# Patient Record
Sex: Female | Born: 2000
Health system: Southern US, Community
[De-identification: ages and names within clinical notes are randomized; demographics above are authoritative.]

## PROBLEM LIST (undated history)

## (undated) DIAGNOSIS — R059 Cough, unspecified: Secondary | ICD-10-CM

## (undated) DIAGNOSIS — J310 Chronic rhinitis: Secondary | ICD-10-CM

## (undated) DIAGNOSIS — J4530 Mild persistent asthma, uncomplicated: Secondary | ICD-10-CM

## (undated) DIAGNOSIS — R05 Cough: Secondary | ICD-10-CM

## (undated) DIAGNOSIS — L209 Atopic dermatitis, unspecified: Secondary | ICD-10-CM

## (undated) HISTORY — DX: Cough: R05

## (undated) HISTORY — DX: Atopic dermatitis, unspecified: L20.9

## (undated) HISTORY — DX: Cough, unspecified: R05.9

## (undated) HISTORY — DX: Mild persistent asthma, uncomplicated: J45.30

## (undated) HISTORY — DX: Chronic rhinitis: J31.0

---
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
## 2019-10-08 ENCOUNTER — Telehealth (HOSPITAL_COMMUNITY): Payer: Self-pay | Admitting: Psychiatry

## 2019-10-08 NOTE — Telephone Encounter (Signed)
D:  Pt's mother Revonda Standard) called to inquire about MH-IOP.  A:  Answered all her questions.  Encouraged her to verify insurance benefits.  According to pt's mother, she will inform pt about MH-IOP and have pt to call case manager if she is interested.  R:  Mother receptive.

## 2019-11-25 ENCOUNTER — Telehealth (HOSPITAL_COMMUNITY): Payer: Self-pay | Admitting: Professional

## 2019-11-25 ENCOUNTER — Telehealth (HOSPITAL_COMMUNITY): Payer: Self-pay | Admitting: Psychiatry

## 2019-11-25 NOTE — Telephone Encounter (Signed)
D:  Patient and case manager been playing phone tag, until today.  A: Patient's therapist Durene Cal) referred pt for group.  Oriented patient.  Upon asking pt a few questions, case manager feels that pt is in need of a higher level of care, preferably inpatient at [facility].  Will contact Caryn Bee.

## 2020-04-21 ENCOUNTER — Telehealth (HOSPITAL_COMMUNITY): Payer: Self-pay | Admitting: Licensed Clinical Social Worker

## 2020-04-27 ENCOUNTER — Other Ambulatory Visit: Payer: Self-pay

## 2020-04-27 ENCOUNTER — Other Ambulatory Visit (HOSPITAL_COMMUNITY): Payer: BC Managed Care – PPO | Attending: Psychiatry | Admitting: Licensed Clinical Social Worker

## 2020-04-27 DIAGNOSIS — R4588 Nonsuicidal self-harm: Secondary | ICD-10-CM | POA: Insufficient documentation

## 2020-04-27 DIAGNOSIS — Z79899 Other long term (current) drug therapy: Secondary | ICD-10-CM | POA: Insufficient documentation

## 2020-04-27 DIAGNOSIS — R45851 Suicidal ideations: Secondary | ICD-10-CM | POA: Insufficient documentation

## 2020-04-27 DIAGNOSIS — F32A Depression, unspecified: Secondary | ICD-10-CM | POA: Insufficient documentation

## 2020-04-27 DIAGNOSIS — F332 Major depressive disorder, recurrent severe without psychotic features: Secondary | ICD-10-CM

## 2020-04-27 DIAGNOSIS — F411 Generalized anxiety disorder: Secondary | ICD-10-CM | POA: Insufficient documentation

## 2020-05-01 ENCOUNTER — Encounter (HOSPITAL_COMMUNITY): Payer: Self-pay

## 2020-05-01 ENCOUNTER — Other Ambulatory Visit: Payer: Self-pay

## 2020-05-01 ENCOUNTER — Other Ambulatory Visit (HOSPITAL_COMMUNITY): Payer: BC Managed Care – PPO | Admitting: Licensed Clinical Social Worker

## 2020-05-01 ENCOUNTER — Other Ambulatory Visit (HOSPITAL_COMMUNITY): Payer: BC Managed Care – PPO | Admitting: Occupational Therapy

## 2020-05-01 DIAGNOSIS — F32A Depression, unspecified: Secondary | ICD-10-CM | POA: Diagnosis not present

## 2020-05-01 DIAGNOSIS — R4588 Nonsuicidal self-harm: Secondary | ICD-10-CM | POA: Diagnosis not present

## 2020-05-01 DIAGNOSIS — R4589 Other symptoms and signs involving emotional state: Secondary | ICD-10-CM

## 2020-05-01 DIAGNOSIS — R41844 Frontal lobe and executive function deficit: Secondary | ICD-10-CM

## 2020-05-01 DIAGNOSIS — F411 Generalized anxiety disorder: Secondary | ICD-10-CM | POA: Diagnosis present

## 2020-05-01 DIAGNOSIS — F603 Borderline personality disorder: Secondary | ICD-10-CM

## 2020-05-01 DIAGNOSIS — R45851 Suicidal ideations: Secondary | ICD-10-CM | POA: Diagnosis not present

## 2020-05-01 DIAGNOSIS — Z79899 Other long term (current) drug therapy: Secondary | ICD-10-CM | POA: Diagnosis not present

## 2020-05-01 DIAGNOSIS — F332 Major depressive disorder, recurrent severe without psychotic features: Secondary | ICD-10-CM

## 2020-05-01 NOTE — Psych (Signed)
Virtual Visit via Video Note  I connected with Nicole Snyder on 04/27/20 at  2:00 PM EST by a video enabled telemedicine application and verified that I am speaking with the correct person using two identifiers.  Location: Patient: patient home Provider: clinical home office   I discussed the limitations of evaluation and management by telemedicine and the availability of in person appointments. The patient expressed understanding and agreed to proceed.  I discussed the assessment and treatment plan with the patient. The patient was provided an opportunity to ask questions and all were answered. The patient agreed with the plan and demonstrated an understanding of the instructions.   The patient was advised to call back or seek an in-person evaluation if the symptoms worsen or if the condition fails to improve as anticipated.  I provided 60 minutes of non-face-to-face time during this encounter.   Nicole Guiles, LCSW    Comprehensive Clinical Assessment (CCA) Note  04/27/2020 Nicole Snyder [id number]  Chief Complaint:  Chief Complaint  Patient presents with  . Depression  . Anxiety   Visit Diagnosis: Major Depressive Disorder  R/O Borderline personality disorder   CCA Biopsychosocial Intake/Chief Complaint:  Pt presents as referral from her therapist, T. Krumroy. Pt states she has been engaging in self harm cutting behaviors 3-4x/week and having passive SI 1-2x/week. Pt states reports struggles with social anxiety, mood swings, and depression. Pt reports struggling with school dynamics in high school and it getting worse in college, especially due to the pandemic changes. Pt reports she took a break from college this semester due to her MH. Pt reports overuse of her Gabapentin at times when upset. Pt denies other substance use, AVH, and HI. Pt denies current SI.  Current Symptoms/Problems: Pt reports depressed mood, anxiety, difficulty being around people, mood  swings, getting angry easily, self harm cutting 3-4x/weeks, overtaking medication (approx 12 pills instead of 3), passive SI 1-2x/week   Patient Reported Schizophrenia/Schizoaffective Diagnosis in Past: No   Strengths: Pt has regular therapist and psychiatrist; support in family  Preferences: No data recorded Abilities: No data recorded  Type of Services Patient Feels are Needed: intensive   Initial Clinical Notes/Concerns: Pt presents with low insight and struggles to make connections with some questions despite reporting she has been in therapy throughout high school to the present. Pt exhibits paucity and poor eye contact.   Mental Health Symptoms Depression:  Worthlessness; Sleep (too much or little); Hopelessness; Irritability; Change in energy/activity; Difficulty Concentrating   Duration of Depressive symptoms: Greater than two weeks   Mania:  None   Anxiety:   Irritability; Restlessness; Sleep; Tension; Worrying   Psychosis:  None   Duration of Psychotic symptoms: No data recorded  Trauma:  None   Obsessions:  None   Compulsions:  None   Inattention:  None   Hyperactivity/Impulsivity:  N/A   Oppositional/Defiant Behaviors:  None   Emotional Irregularity:  Intense/unstable relationships; Mood lability; Recurrent suicidal behaviors/gestures/threats; Potentially harmful impulsivity; Intense/inappropriate anger; Chronic feelings of emptiness; Unstable self-image   Other Mood/Personality Symptoms:  No data recorded   Mental Status Exam Appearance and self-care  Stature:  Average   Weight:  Average weight   Clothing:  Casual   Grooming:  Normal   Cosmetic use:  None   Posture/gait:  Normal   Motor activity:  Not Remarkable   Sensorium  Attention:  Confused; Normal   Concentration:  Normal; Anxiety interferes   Orientation:  X5   Recall/memory:  Normal  Affect and Mood  Affect:  Blunted; Appropriate   Mood:  Depressed; Anxious   Relating   Eye contact:  Fleeting   Facial expression:  Depressed; Anxious   Attitude toward examiner:  Cooperative; Passive   Thought and Language  Speech flow: Paucity   Thought content:  Appropriate to Mood and Circumstances   Preoccupation:  Ruminations   Hallucinations:  None   Organization:  No data recorded  [employer] of Knowledge:  Fair   Intelligence:  Average   Abstraction:  Functional   Judgement:  Poor   Reality Testing:  Adequate   Insight:  Lacking   Decision Making:  Vacilates   Social Functioning  Social Maturity:  No data recorded  Social Judgement:  No data recorded  Stress  Stressors:  Transitions   Coping Ability:  Deficient supports   Skill Deficits:  No data recorded  Supports:  Family     Religion:    Leisure/Recreation: Leisure / Recreation Do You Have Hobbies?: No  Exercise/Diet: Exercise/Diet Do You Exercise?: Yes What Type of Exercise Do You Do?: Run/Walk Have You Gained or Lost A Significant Amount of Weight in the Past Six Months?: No Do You Follow a Special Diet?: No Do You Have Any Trouble Sleeping?: Yes Explanation of Sleeping Difficulties: Pt reports difficulty falling asleep   CCA Employment/Education Employment/Work Situation: Employment / Work Situation Employment situation: Employed Where is patient currently employed?: [employer] How long has patient been employed?: approx 7 mo Patient's job has been impacted by current illness: No Has patient ever been in the [employer]?: No  Education: Education Is Patient Currently Attending School?: No Did Garment/textile technologist From [employer]?: Yes Did Theme park manager?: Yes Did You Have An Individualized Education Program (IIEP): No Patient's Education Has Been Impacted by Current Illness: Yes How Does Current Illness Impact Education?: Pt is on semester break from school due to struggling with MH   CCA Family/Childhood History Family and Relationship  History: Family history Marital status: Single Are you sexually active?: No Does patient have children?: No  Childhood History:  Childhood History By whom was/is the patient raised?: Mother/father and step-parent Additional childhood history information: Pt reports she saw her father drown at age 86 Description of patient's relationship with caregiver when they were a child: Pt reports good relationship with mom and step-dad Patient's description of current relationship with people who raised him/her: Pt reports mom is a major support; good with step-dad Does patient have siblings?: Yes Number of Siblings: 3 Description of patient's current relationship with siblings: Pt has older sister, older step-brother, and younger step-sister - pt reports "good" relationships Did patient suffer any verbal/emotional/physical/sexual abuse as a child?: No Did patient suffer from severe childhood neglect?: No Has patient ever been sexually abused/assaulted/raped as an adolescent or adult?: No Was the patient ever a victim of a crime or a disaster?: No Witnessed domestic violence?: No Has patient been affected by domestic violence as an adult?: No  Child/Adolescent Assessment:     CCA Substance Use Alcohol/Drug Use: Alcohol / Drug Use Pain Medications: Pt denies Prescriptions: see MAR Over the Counter: pt denies History of alcohol / drug use?: No history of alcohol / drug abuse (Pt reports overtaking her Gabapentin "sometimes" to feel "happy, light." Pt is prescribed the medication 3x/day and has taken up to 12 pills at a time. Pt states this happens less now that her mom monitors her medication)  ASAM's:  Six Dimensions of Multidimensional Assessment  Dimension 1:  Acute Intoxication and/or Withdrawal Potential:      Dimension 2:  Biomedical Conditions and Complications:      Dimension 3:  Emotional, Behavioral, or Cognitive Conditions and Complications:      Dimension 4:  Readiness to Change:     Dimension 5:  Relapse, Continued use, or Continued Problem Potential:     Dimension 6:  Recovery/Living Environment:     ASAM Severity Score:    ASAM Recommended Level of Treatment:     Substance use Disorder (SUD)    Recommendations for Services/Supports/Treatments: Recommendations for Services/Supports/Treatments Recommendations For Services/Supports/Treatments: Partial Hospitalization (Pt is recommended PHP due to ongoing SI/self harm and need for increased support for stabilization)  DSM5 Diagnoses: Patient Active Problem List   Diagnosis Date Noted  . Well adolescent visit 05/29/2017  . Folliculitis of both axillae 03/01/2017  . Insomnia 09/19/2016  . Borderline personality disorder (HCC) 03/08/2016  . Mood disorder (HCC) 09/27/2015  . Anxiety 09/02/2015  . Wheezing 10/05/2014  . BMI (body mass index), pediatric, 85% to less than 95% for age 74/18/2016  . Asthma, mild persistent 02/04/2011  . Atopic dermatitis 02/04/2011  . Environmental allergies 01/30/2011  . Pollen allergies 01/30/2011  . Cat allergies 01/30/2011    Patient Centered Plan: Patient is on the following Treatment Plan(s):  Anxiety and Depression   Referrals to Alternative Service(s): Referred to Alternative Service(s):   Place:   Date:   Time:    Referred to Alternative Service(s):   Place:   Date:   Time:    Referred to Alternative Service(s):   Place:   Date:   Time:    Referred to Alternative Service(s):   Place:   Date:   Time:     Nicole Guiles, LCSW

## 2020-05-01 NOTE — Therapy (Signed)
[facility] PARTIAL HOSPITALIZATION PROGRAM [address] [place], [place], [address] Phone: [phone]   Fax:  [phone] Virtual Visit via Video Note  I connected with Nicole Snyder on 05/01/20 at  11:00 AM EST by a video enabled telemedicine application and verified that I am speaking with the correct person using two identifiers.  Location: Patient: Patient Home Provider: Clinic Office   I discussed the limitations of evaluation and management by telemedicine and the availability of in person appointments. The patient expressed understanding and agreed to proceed.  I discussed the assessment and treatment plan with the patient. The patient was provided an opportunity to ask questions and all were answered. The patient agreed with the plan and demonstrated an understanding of the instructions.   The patient was advised to call back or seek an in-person evaluation if the symptoms worsen or if the condition fails to improve as anticipated.  I provided 90 minutes of non-face-to-face time during this encounter. 25 minutes OT Evaluation 65 minute OT Group Therapy   Nicole Snyder, OT   Occupational Therapy Evaluation  Patient Details  Name: Nicole Snyder MRN: [id number] Date of Birth: 01/13/2001 Referring Provider (OT): Hillery Jacks   Encounter Date: 05/01/2020   OT End of Session - 05/01/20 1505    Visit Number 1    Number of Visits 20    Date for OT Re-Evaluation 05/29/20    Authorization Type [employer]    OT Start Time 1110   OT Eval [id number]   OT Stop Time 1215    OT Time Calculation (min) 65 min    Activity Tolerance Patient tolerated treatment well    Behavior During Therapy [facility] for tasks assessed/performed           Past Medical History:  Diagnosis Date  . Asthma   . Asthma, mild persistent 02/04/2011  . Atopic dermatitis 02/04/2011  . Constipation   . Cough   . Rhinitis     History reviewed. No pertinent surgical  history.  There were no vitals filed for this visit.   Subjective Assessment - 05/01/20 1503    Currently in Pain? No/denies    Multiple Pain Sites No             [facility] OT Assessment - 05/01/20 0001      Assessment   Medical Diagnosis Borderline personality disorder    Referring Provider (OT) Hillery Jacks      Precautions   Precautions None      Balance Screen   Has the patient fallen in the past 6 months No    Has the patient had a decrease in activity level because of a fear of falling?  No    Is the patient reluctant to leave their home because of a fear of falling?  No           OT Education - 05/01/20 1504    Education Details Educated on OT within [facility] programming, along with concept of sensory modulation and self-soothing as coping strategies through use of the eight senses    Person(s) Educated Patient    Methods Explanation;Handout    Comprehension Verbalized understanding          Occupational Therapy Assessment 05/01/2020  Nicole Snyder is a 19 y/o female with PMHx of mood disorder and borderline personality disorder who was referred to [facility] from her individual OP therapist for reports of increased suicidal thoughts and self-harm, along with increased depression and anxiety, affecting  her function in ADL/iADLs. Pt reports several increased psychosocial stressors including taking a medical leave of absence from college, living at home with her family, lack of engagement in leisure interests/hobbies, and lack of social supports/friendships, alongside managing her mental health. Pt reports desire to engage in PHP programming in order to manage identified stressors and to engage meaningfully in identified areas of occupation and ADL/iADLs. Upon approach, pt presents virtually, appears fairly groomed, camera on, microphone working and present throughout session. Pt present throughout session, appeared to be in bedroom. Pt reports enjoying reading, watching anime, and going for  walk and identifies goal for admission "I've been in a spiral of depression for over a year now".   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 Hurt myself (self-harm), Isolate from others, Cry, Sleep, Listen to music and Act out Pt also reports that she takes medication "impulsively" and "not as prescribed" stating she uses excessive amounts of medication as a coping strategy "sometimes"  What helps when you are not feeling well? Wrapping in a blanket, Taking a shower or bath, Going for a walk and Watching TV  What are some things that make it MORE difficult for you when you are already upset? People staring at me and People in my room  Is there anything specific that you would like help with while you're in the hospital? Anger Management, Impulse Control, Stress Management, Self-Harm Urges and Self-esteem   What is your goal while you are here?  "I've been in a spiral of depression for over a  year now".   Assessment: Pt demonstrates behavior that inhibits/restricts participation in occupation and would benefit from skilled occupational therapy services to address current difficulties with symptom management, emotion regulation, socialization, stress management, time management, job readiness, financial wellness, health and nutrition, sleep hygiene, ADL/iADL performance and leisure participation, in preparation for reintegration and return to community at discharge.   Plan: Pt will participate in skilled occupational therapy sessions (group and/or individual) in order to promote daily structure, social engagement, and opportunities to develop and utilize adaptive strategies to maximize functional performance in preparation for safe transition and integration back into school, work, and/or the community at discharge. OT sessions will occur 4-5 x per week for 2-4 weeks.   Nicole Snyder, MOT, OTR/L  Group Session:  S: "Petting my dog is super calming, because he is really fluffy and soft to me."  O: Today's group session focused on topic of sensory modulation and self-soothing through use of the 8 senses. Discussion introduced the concept of sensory modulation and  integration, focusing on how we can utilize our body and it's senses to self-soothe or cope, when we are experiencing an over or under-whelming sensation or feeling. Group members were introduced to a sensory diet checklist as a helpful tool/resource that can be utilized to identify what activities and strategies we prefer and do not prefer based upon our response to different stimulus. The concept of alerting vs calming activities was also introduced to understand how to counteract how we are feeling (Example: when we are feeling overwhelmed/stressed, engage in something calming. When we are feeling depressed/low energy, engage in something alerting). Group members engaged actively in discussion sharing their own personal sensory  likes/dislikes.    A: Nicole Snyder was active and independent in her participation of discussion and activity, sharing several examples and activities that she engages in to promote self-soothing. She shared that the intense emotions she often tries to manage are anxiety, anger, and urges to engage in self-harm (cutting). She shared that some calming activities for her include petting her dog and taking a really hot shower. She identified some alerting sensory activities as listening to the sound of rain or going for a walk outside. She appeared both engaged and receptive to additional education and strategies reviewed during session and offered from leader and other peers.   P: Continue to attend PHP OT group sessions 5x week for 4 weeks to promote daily structure, social engagement, and opportunities to develop and utilize adaptive strategies to maximize functional performance in preparation for safe transition and integration back into school, work, and the community.  05/01/2020  Nicole Snyder, MOT, OTR/L  Plan - 05/01/20 1505    Clinical Impression Statement Nicole Snyder is a 19 y/o female with PMHx of mood disorder and borderline personality disorder who [facility] from her individual OP therapist for reports of increased suicidal thoughts and self-harm, along with increased depression and anxiety, affecting her function in ADL/iADLs. Pt reports several increased psychosocial stressors including taking a medical leave of absence from college, living at home with her family, lack of engagement in leisure interests/hobbies, and lack of social supports/friendships, alongside managing her mental health. Pt reports desire to engage in PHP programming in order to manage identified stressors and to engage meaningfully in identified areas of occupation and ADL/iADLs.    OT Occupational Profile and History Problem Focused Assessment - Including review of records relating to presenting problem    Occupational  performance deficits (Please refer to evaluation for details): ADL's;IADL's;Rest and Sleep;Education;Work;Leisure;Social Participation    Body Structure / Function / Physical Skills ADL    Cognitive Skills Attention;Consciousness;Emotional;Energy/Drive;Learn;Memory;Orientation;Problem Solve;Perception;Safety Awareness;Temperament/Personality;Thought;Understand    Psychosocial Skills Coping Strategies;Environmental  Adaptations;Habits;Interpersonal Interaction;Routines and Behaviors    Rehab Potential Good    Clinical Decision Making Limited treatment options, no task modification necessary    Comorbidities Affecting Occupational Performance: May have comorbidities impacting occupational performance    Modification or Assistance to Complete Evaluation  No modification of tasks or assist necessary to complete eval    OT Frequency 5x / week    OT Duration 4 weeks    OT Treatment/Interventions Self-care/ADL training;Coping strategies training;Psychosocial skills training;Patient/family education    Consulted and Agree with Plan of Care Patient           Patient will benefit from skilled therapeutic intervention in order to improve the following deficits and impairments:   Body Structure / Function / Physical Skills: ADL Cognitive Skills: Attention,Consciousness,Emotional,Energy/Drive,Learn,Memory,Orientation,Problem Solve,Perception,Safety Awareness,Temperament/Personality,Thought,Understand Psychosocial Skills: Coping Strategies,Environmental  Adaptations,Habits,Interpersonal Interaction,Routines and  Behaviors   Visit Diagnosis: Difficulty coping  Frontal lobe and executive function deficit  Borderline personality disorder [facility])    Problem List Patient Active Problem List   Diagnosis Date Noted  . Well adolescent visit 05/29/2017  . Folliculitis of both axillae 03/01/2017  . Insomnia 09/19/2016  . Borderline personality disorder (HCC) 03/08/2016  . Mood disorder (HCC) 09/27/2015   . Anxiety 09/02/2015  . Wheezing 10/05/2014  . BMI (body mass index), pediatric, 85% to less than 95% for age 30/18/2016  . Asthma, mild persistent 02/04/2011  . Atopic dermatitis 02/04/2011  . Environmental allergies 01/30/2011  . Pollen allergies 01/30/2011  . Cat allergies 01/30/2011    Nicole Snyder 05/01/2020, 3:16 PM  [facility] PARTIAL HOSPITALIZATION PROGRAM [address] [place], [place], [address] Phone: [phone]   Fax:  [phone]  Name: KAMAIYA ANTILLA MRN: [id number] Date of Birth: [birth year]

## 2020-05-01 NOTE — Progress Notes (Signed)
Spoke with patient via webex video call, used 2 identifiers to correctly identify patient. She was recommended for the program by her therapist for depression and anxiety. Over the past year she has had more depression and some thoughts of suicide. No plan or intent. Denies any thoughts of suicide today. Denies HI or AV hallucinations. Has been seeing a therapist for 4 years. On scale 1-10 as 10 being worst she rates depression at 1 and anxiety at 3. PHQ9=18. No side effects from medication. No issues or complaints. Her first day in group therapy is going well.

## 2020-05-02 ENCOUNTER — Other Ambulatory Visit: Payer: Self-pay

## 2020-05-02 ENCOUNTER — Encounter (HOSPITAL_COMMUNITY): Payer: Self-pay | Admitting: Family

## 2020-05-02 ENCOUNTER — Other Ambulatory Visit (HOSPITAL_COMMUNITY): Payer: BC Managed Care – PPO | Admitting: Licensed Clinical Social Worker

## 2020-05-02 ENCOUNTER — Encounter (HOSPITAL_COMMUNITY): Payer: Self-pay

## 2020-05-02 ENCOUNTER — Other Ambulatory Visit (HOSPITAL_COMMUNITY): Payer: BC Managed Care – PPO | Admitting: Occupational Therapy

## 2020-05-02 DIAGNOSIS — F322 Major depressive disorder, single episode, severe without psychotic features: Secondary | ICD-10-CM

## 2020-05-02 DIAGNOSIS — F411 Generalized anxiety disorder: Secondary | ICD-10-CM | POA: Diagnosis not present

## 2020-05-02 DIAGNOSIS — R4589 Other symptoms and signs involving emotional state: Secondary | ICD-10-CM

## 2020-05-02 DIAGNOSIS — F603 Borderline personality disorder: Secondary | ICD-10-CM

## 2020-05-02 DIAGNOSIS — R41844 Frontal lobe and executive function deficit: Secondary | ICD-10-CM

## 2020-05-02 NOTE — Progress Notes (Signed)
Virtual Visit via Video Note  I connected with Nicole Snyder on 05/02/20 at  9:00 AM EST by a video enabled telemedicine application and verified that I am speaking with the correct person using two identifiers.  Location: Patient: Home Provider: Office   I discussed the limitations of evaluation and management by telemedicine and the availability of in person appointments. The patient expressed understanding and agreed to proceed.    I discussed the assessment and treatment plan with the patient. The patient was provided an opportunity to ask questions and all were answered. The patient agreed with the plan and demonstrated an understanding of the instructions.   The patient was advised to call back or seek an in-person evaluation if the symptoms worsen or if the condition fails to improve as anticipated.  I provided 15 minutes of non-face-to-face time during this encounter.   [name], NP    Behavioral Health Partial Program Assessment Note  Date: 05/02/2020 Name: Nicole Snyder MRN: [id number]    HPI: Nicole Snyder is a 19 y.o. Caucasian female presents with depression and suicidal ideaitons.Patient reports she is currently followed by therapy and psychiatry. Currently in treatment with [name] and [name] Psychiatrist . [name] states she is prescribed Gabapentin 800 mg and no longer takes hydroxyzine at this time. Reports diagnosis with depression and  generalized anxiety disorder. [name] stated that she noticed her anxiety and depression has gotten worse due to returning back to school in person. [name] reported she is attending [employer] with a undeclared major. Reported that her anxiety  has been "hard to deal with " in the past.  Reports she is unsure of her major currently. [name] reports a history of self injurious behaviors cutting " as a coping skill".  [name] reported struggling with depression, feelings of worthlessness and mood irritabilty.   Patient reported that she is currently employed and enjoys her job. patient was enrolled in partial psychiatric program on 05/02/20.    Primary complaints include: Patient denied previous inpatient admissions.  Denies history of physical sexual abuse.  Denies illicit drug use.  anxiety and feeling depressed.  Onset of symptoms was gradual with gradually worsening course since that time.  Patient reported family history with mental illness: Father/Mother: Depression/ Anxeity Psychosocial Stressors include the following: school.   I have reviewed the following documentation dated 05/02/2020: past psychiatric history, past medical history and past social and family history  Complaints of Pain: headachesar Past Psychiatric History:  Previous suicide attempts  In "hight school"   Substance Abuse History: none Use of Alcohol: denied Use of Caffeine: denies use Use of over the counter:   No past surgical history on file.  Past Medical History:  Diagnosis Date  . Asthma   . Asthma, mild persistent 02/04/2011  . Atopic dermatitis 02/04/2011  . Constipation   . Cough   . Rhinitis    Outpatient Encounter Medications as of 05/02/2020  Medication Sig  . cetirizine (ZYRTEC) 10 MG tablet Take 10 mg by mouth daily.  [name] EPIPEN 2-PAK 0.3 MG/0.3ML DEVI Inject 0.3 mg into the muscle as needed (for allergic reaction).   . fluticasone (FLONASE) 50 MCG/ACT nasal spray USE 2 SPRAYS IN EACH NOSTRIL ONCE A DAY  . gabapentin (NEURONTIN) 300 MG capsule TAKE 3 CAPSULES TWICE A DAY  . hydrOXYzine (ATARAX/VISTARIL) 10 MG tablet TAKE 1 TO 3 TABLETS 3 TIMES A DAY (Patient not taking: No sig reported)  . montelukast (SINGULAIR) 10 MG tablet TAKE 1  TABLET AT BEDTIME (Patient not taking: No sig reported)  . mupirocin ointment (BACTROBAN) 2 % Apply 1 application topically 2 (two) times daily. (Patient not taking: No sig reported)  . traZODone (DESYREL) 50 MG tablet Take 50 mg by mouth at bedtime.  . triamcinolone cream  (KENALOG) 0.5 % APPLY TO AFFECTED AREA TWICE A DAY   No facility-administered encounter medications on file as of 05/02/2020.   Allergies  Allergen Reactions  . Mold Extract [Trichophyton Mentagrophyte]   . Pollen Extract-Tree Extract     Social History   Tobacco Use  . Smoking status: Never Smoker  . Smokeless tobacco: Never Used  Substance Use Topics  . Alcohol use: No   Functioning Relationships: good support system and good relationship with parents Education: College       Please specify degree: undecided  Other Pertinent History: None Family History  Problem Relation Age of Onset  . Heart disease Father   . Hyperlipidemia Father   . Hypertension Father   . Asthma Father   . Depression Father   . Heart disease Maternal Grandmother   . Cancer Maternal Grandmother        breeast  . Stroke Maternal Grandmother   . Hypertension Maternal Grandmother   . Depression Maternal Grandmother   . Hearing loss Maternal Grandfather   . Heart disease Paternal Grandmother   . Hyperlipidemia Paternal Grandmother   . Hypertension Paternal Grandmother   . Heart disease Paternal Grandfather   . Hyperlipidemia Paternal Grandfather   . Hypertension Paternal Grandfather   . Depression Mother   . Asthma Maternal Aunt   . Asthma Paternal Aunt   . Alcohol abuse Neg Hx   . Arthritis Neg Hx   . Birth defects Neg Hx   . COPD Neg Hx   . Diabetes Neg Hx   . Drug abuse Neg Hx   . Early death Neg Hx   . Kidney disease Neg Hx   . Learning disabilities Neg Hx   . Mental illness Neg Hx   . Mental retardation Neg Hx   . Miscarriages / Stillbirths Neg Hx   . Vision loss Neg Hx   . Varicose Veins Neg Hx      Review of Systems Constitutional: negative  Objective:  There were no vitals filed for this visit.  Physical Exam:   Mental Status Exam: Appearance:  Well groomed Psychomotor::  Within Normal Limits Attention span and concentration: Normal Behavior: calm and  cooperative Speech:  normal volume Mood:  depressed Affect:  normal Thought Process:  Coherent Thought Content:  Logical Orientation:  person and place Cognition:  grossly intact Insight:  Intact Judgment:  Intact Estimate of Intelligence: Average Fund of knowledge: Aware of current events Memory: Recent and remote intact Abnormal movements: None Gait and station: tele-assesment unable to assess  sitting   Assessment:   Diagnosis: No primary diagnosis found. No diagnosis found.  Indications for admission: inpatient care required if not in partial hospital program  Plan: Orders placed for Occupational Therapy (OT) patient enrolled in Partial Hospitalization Program, patient's current medications are to be continued, a comprehensive treatment plan will be developed and side effects of medications have been reviewed with patient  Treatment options and alternatives reviewed with patient and patient understands the above plan.  Treatment plan was reviewed and agreed upon by NP T. Melvyn Neth and patient Vauda Salvucci need for group services   [name], NP

## 2020-05-02 NOTE — Therapy (Signed)
[facility] PARTIAL HOSPITALIZATION PROGRAM [address] [place], [place], [address] Phone: [phone]   Fax:  [phone] Virtual Visit via Video Note  I connected with Nicole Snyder on 05/02/20 at  11:00 AM EST by a video enabled telemedicine application and verified that I am speaking with the correct person using two identifiers.  Location: Patient: Patient Home Provider: Clinic Office   I discussed the limitations of evaluation and management by telemedicine and the availability of in person appointments. The patient expressed understanding and agreed to proceed.   I discussed the assessment and treatment plan with the patient. The patient was provided an opportunity to ask questions and all were answered. The patient agreed with the plan and demonstrated an understanding of the instructions.   The patient was advised to call back or seek an in-person evaluation if the symptoms worsen or if the condition fails to improve as anticipated.  I provided 65 minutes of non-face-to-face time during this encounter.   Donne Hazel, OT   Occupational Therapy Treatment  Patient Details  Name: Nicole Snyder MRN: [id number] Date of Birth: [birth year] Referring Provider (OT): Hillery Jacks   Encounter Date: 05/02/2020   OT End of Session - 05/02/20 1446    Visit Number 2    Number of Visits 20    Date for OT Re-Evaluation 05/29/20    Authorization Type BCBS    OT Start Time 1105    OT Stop Time 1210    OT Time Calculation (min) 65 min    Activity Tolerance Patient tolerated treatment well    Behavior During Therapy [facility] for tasks assessed/performed           Past Medical History:  Diagnosis Date  . Asthma   . Asthma, mild persistent 02/04/2011  . Atopic dermatitis 02/04/2011  . Constipation   . Cough   . Rhinitis     History reviewed. No pertinent surgical history.  There were no vitals filed for this visit.   Subjective  Assessment - 05/02/20 1446    Currently in Pain? No/denies    Multiple Pain Sites No           OT Education - 05/02/20 1446    Education Details Educated on tips and strategies to improve overall self-care    Person(s) Educated Patient    Methods Explanation;Handout    Comprehension Verbalized understanding            OT Short Term Goals - 05/02/20 1447      OT SHORT TERM GOAL #1   Status On-going      OT SHORT TERM GOAL #2   Status On-going      OT SHORT TERM GOAL #3   Status On-going      OT SHORT TERM GOAL #4   Status On-going         Group Session:  S: "I struggle with food, like I have a bad habit right now of overeating and I know it's nothing healthy."  O: Today's group session focused on topic of self-care and group reviewed a self-care assessment that identified a specific category within self-care that they needed the most improvement in, including physical, emotional/psychological, social, spiritual, and professional self-care. Discussion focused on members sharing which areas they need the most work in and brainstormed strategies and tips on improving one's self-care with the thought that what someone identifies as their "best self" changes day-to-day contingent upon mood, emotions, and situations. Members identified  both one "big" and one "small" self-care activity they can and would like to engage in the near future.   A: Nicole Snyder was less engaged in group discussion and activity than her peers, however was receptive to [employer] verbal cues from clinician to increase engagement. She shared that one thing she struggles with is nutrition, and more specifically, over eating. She shared that there is "more to the story" however declined to share her eating habits further within the group setting. She also expressed need to improve overall in physical self-care, sharing that although she identifies walking as one of her biggest coping skills, she does not get enough  exercise. She identified one of her self-care strengths as engaging in leisure hobbies and rewarding herself with self-care activities like shopping and doing things for herself.  P: Continue to attend PHP OT group sessions 5x week for 2 weeks to promote daily structure, social engagement, and opportunities to develop and utilize adaptive strategies to maximize functional performance in preparation for safe transition and integration back into school, work, and the community. Plan to address topic of health and wellness in next OT group session.   Plan - 05/02/20 1446    Occupational performance deficits (Please refer to evaluation for details): ADL's;IADL's;Rest and Sleep;Education;Work;Leisure;Social Participation    Body Structure / Function / Physical Skills ADL    Cognitive Skills Attention;Consciousness;Emotional;Energy/Drive;Learn;Memory;Orientation;Problem Solve;Perception;Safety Awareness;Temperament/Personality;Thought;Understand    Psychosocial Skills Coping Strategies;Environmental  Adaptations;Habits;Interpersonal Interaction;Routines and Behaviors           Patient will benefit from skilled therapeutic intervention in order to improve the following deficits and impairments:   Body Structure / Function / Physical Skills: ADL Cognitive Skills: Attention,Consciousness,Emotional,Energy/Drive,Learn,Memory,Orientation,Problem Solve,Perception,Safety Awareness,Temperament/Personality,Thought,Understand Psychosocial Skills: Coping Strategies,Environmental  Adaptations,Habits,Interpersonal Interaction,Routines and Behaviors   Visit Diagnosis: Difficulty coping  Frontal lobe and executive function deficit  Borderline personality disorder [facility])    Problem List Patient Active Problem List   Diagnosis Date Noted  . Well adolescent visit 05/29/2017  . Folliculitis of both axillae 03/01/2017  . Insomnia 09/19/2016  . Borderline personality disorder (HCC) 03/08/2016  . Mood disorder  (HCC) 09/27/2015  . Anxiety 09/02/2015  . Wheezing 10/05/2014  . BMI (body mass index), pediatric, 85% to less than 95% for age 39/18/2016  . Asthma, mild persistent 02/04/2011  . Atopic dermatitis 02/04/2011  . Environmental allergies 01/30/2011  . Pollen allergies 01/30/2011  . Cat allergies 01/30/2011    05/02/2020  Donne Hazel, MOT, OTR/L  05/02/2020, 2:47 PM  [facility] PARTIAL HOSPITALIZATION PROGRAM [address] [place], [place], [address] Phone: [phone]   Fax:  [phone]  Name: Nicole Snyder MRN: [id number] Date of Birth: [birth year]

## 2020-05-03 ENCOUNTER — Other Ambulatory Visit (HOSPITAL_COMMUNITY): Payer: BC Managed Care – PPO

## 2020-05-03 ENCOUNTER — Other Ambulatory Visit: Payer: Self-pay

## 2020-05-04 ENCOUNTER — Other Ambulatory Visit (HOSPITAL_COMMUNITY): Payer: BC Managed Care – PPO

## 2020-05-04 ENCOUNTER — Other Ambulatory Visit: Payer: Self-pay

## 2020-05-05 ENCOUNTER — Other Ambulatory Visit: Payer: Self-pay

## 2020-05-05 ENCOUNTER — Other Ambulatory Visit (HOSPITAL_COMMUNITY): Payer: BC Managed Care – PPO | Admitting: Licensed Clinical Social Worker

## 2020-05-05 ENCOUNTER — Other Ambulatory Visit (HOSPITAL_COMMUNITY): Payer: BC Managed Care – PPO

## 2020-05-05 DIAGNOSIS — F332 Major depressive disorder, recurrent severe without psychotic features: Secondary | ICD-10-CM

## 2020-05-08 ENCOUNTER — Other Ambulatory Visit: Payer: Self-pay

## 2020-05-08 ENCOUNTER — Other Ambulatory Visit (HOSPITAL_COMMUNITY): Payer: BC Managed Care – PPO | Admitting: Occupational Therapy

## 2020-05-08 ENCOUNTER — Other Ambulatory Visit (HOSPITAL_COMMUNITY): Payer: BC Managed Care – PPO | Admitting: Licensed Clinical Social Worker

## 2020-05-08 ENCOUNTER — Encounter (HOSPITAL_COMMUNITY): Payer: Self-pay

## 2020-05-08 DIAGNOSIS — R4589 Other symptoms and signs involving emotional state: Secondary | ICD-10-CM

## 2020-05-08 DIAGNOSIS — F603 Borderline personality disorder: Secondary | ICD-10-CM

## 2020-05-08 DIAGNOSIS — R41844 Frontal lobe and executive function deficit: Secondary | ICD-10-CM

## 2020-05-08 DIAGNOSIS — F411 Generalized anxiety disorder: Secondary | ICD-10-CM | POA: Diagnosis not present

## 2020-05-08 DIAGNOSIS — F332 Major depressive disorder, recurrent severe without psychotic features: Secondary | ICD-10-CM

## 2020-05-08 NOTE — Therapy (Signed)
[facility] PARTIAL HOSPITALIZATION PROGRAM [address] [place], [place], [address] Phone: [phone]   Fax:  [phone] Virtual Visit via Video Note  I connected with [name] on 05/08/20 at  11:00 AM EST by a video enabled telemedicine application and verified that I am speaking with the correct person using two identifiers.  Location: Patient: Patient Home Provider: Clinic Office   I discussed the limitations of evaluation and management by telemedicine and the availability of in person appointments. The patient expressed understanding and agreed to proceed.  I discussed the assessment and treatment plan with the patient. The patient was provided an opportunity to ask questions and all were answered. The patient agreed with the plan and demonstrated an understanding of the instructions.   The patient was advised to call back or seek an in-person evaluation if the symptoms worsen or if the condition fails to improve as anticipated.  I provided 65 minutes of non-face-to-face time during this encounter.   Donne Hazel, OT   Occupational Therapy Treatment  Patient Details  Name: Nicole Snyder MRN: [id number] Date of Birth: 22-Jul-2000 Referring Provider (OT): Hillery Jacks   Encounter Date: 05/08/2020   OT End of Session - 05/08/20 1318    Visit Number 3    Number of Visits 20    Date for OT Re-Evaluation 05/29/20    Authorization Type BCBS    OT Start Time 1120    OT Stop Time 1225    OT Time Calculation (min) 65 min    Activity Tolerance Patient tolerated treatment well    Behavior During Therapy Flat affect           Past Medical History:  Diagnosis Date  . Asthma   . Asthma, mild persistent 02/04/2011  . Atopic dermatitis 02/04/2011  . Constipation   . Cough   . Rhinitis     History reviewed. No pertinent surgical history.  There were no vitals filed for this visit.   Subjective Assessment - 05/08/20 1318     Currently in Pain? No/denies    Multiple Pain Sites No           OT Education - 05/08/20 1318    Education Details Educated on identifying worry and utilized circle on control tool to categorize what we do and do not have control over    Person(s) Educated Patient    Methods Explanation;Handout    Comprehension Verbalized understanding            OT Short Term Goals - 05/02/20 1447      OT SHORT TERM GOAL #1   Status On-going      OT SHORT TERM GOAL #2   Status On-going      OT SHORT TERM GOAL #3   Status On-going      OT SHORT TERM GOAL #4   Status On-going         Group Session:  S: "My small worry is cooking Christmas dinner, well I have to help. So I think I am worried about messing it up or something cause my mom isn't great with the directions."  O: Group session encouraged increased participation and engagement through discussion focused on worry and our circle of control. Group reviewed a powerpoint that discussed healthy vs unhealthy worry with specific examples provided. Discussion also focused on utilizing the circle of control outline to identify what is within our control, what we have influence on, and what is not in  our control. Group members shared specific examples and worries and identified what categories they fell in within the circle of control.   A: Nichelle was largely withdrawn and flat throughout today's group session. Pt did not have her camera on for the duration of group despite cues to do so. Pt was active in sharing her small worry, identifying making Christmas dinner with her mom as something that was worrying her. She shared that she is nervous about messing it up or doing something wrong and attributed this to a lack of direction and communication from Mom. When it came time to share scenarios and identify what she can and cannot control about the scenario, pt declined and stated "Can I skip this part?" Pt denied any concerns, however continued to  decline participation in the remainder of session.   P: Continue to attend PHP OT group sessions 5x week for 3 weeks to promote daily structure, social engagement, and opportunities to develop and utilize adaptive strategies to maximize functional performance in preparation for safe transition and integration back into school, work, and the community.   Plan - 05/08/20 1319    Occupational performance deficits (Please refer to evaluation for details): ADL's;IADL's;Rest and Sleep;Education;Work;Leisure;Social Participation    Body Structure / Function / Physical Skills ADL    Cognitive Skills Attention;Consciousness;Emotional;Energy/Drive;Learn;Memory;Orientation;Problem Solve;Perception;Safety Awareness;Temperament/Personality;Thought;Understand    Psychosocial Skills Coping Strategies;Environmental  Adaptations;Habits;Interpersonal Interaction;Routines and Behaviors           Patient will benefit from skilled therapeutic intervention in order to improve the following deficits and impairments:   Body Structure / Function / Physical Skills: ADL Cognitive Skills: Attention,Consciousness,Emotional,Energy/Drive,Learn,Memory,Orientation,Problem Solve,Perception,Safety Awareness,Temperament/Personality,Thought,Understand Psychosocial Skills: Coping Strategies,Environmental  Adaptations,Habits,Interpersonal Interaction,Routines and Behaviors   Visit Diagnosis: Difficulty coping  Frontal lobe and executive function deficit  Borderline personality disorder [facility])    Problem List Patient Active Problem List   Diagnosis Date Noted  . Well adolescent visit 05/29/2017  . Folliculitis of both axillae 03/01/2017  . Insomnia 09/19/2016  . Borderline personality disorder (HCC) 03/08/2016  . Mood disorder (HCC) 09/27/2015  . Anxiety 09/02/2015  . Wheezing 10/05/2014  . BMI (body mass index), pediatric, 85% to less than 95% for age 20/18/2016  . Asthma, mild persistent 02/04/2011  . Atopic  dermatitis 02/04/2011  . Environmental allergies 01/30/2011  . Pollen allergies 01/30/2011  . Cat allergies 01/30/2011    05/08/2020  Donne Hazel, MOT, OTR/L  05/08/2020, 1:19 PM  [facility] HOSPITALIZATION PROGRAM [address] [place], [place], [address] Phone: [phone]   Fax:  [phone]  Name: ADDELYN ALLEMAN MRN: [id number] Date of Birth: Mar 04, 2001

## 2020-05-09 ENCOUNTER — Encounter (HOSPITAL_COMMUNITY): Payer: Self-pay

## 2020-05-09 ENCOUNTER — Other Ambulatory Visit: Payer: Self-pay

## 2020-05-09 ENCOUNTER — Other Ambulatory Visit (HOSPITAL_COMMUNITY): Payer: BC Managed Care – PPO | Admitting: Occupational Therapy

## 2020-05-09 ENCOUNTER — Other Ambulatory Visit (HOSPITAL_COMMUNITY): Payer: BC Managed Care – PPO | Admitting: Licensed Clinical Social Worker

## 2020-05-09 DIAGNOSIS — R41844 Frontal lobe and executive function deficit: Secondary | ICD-10-CM

## 2020-05-09 DIAGNOSIS — F603 Borderline personality disorder: Secondary | ICD-10-CM

## 2020-05-09 DIAGNOSIS — F411 Generalized anxiety disorder: Secondary | ICD-10-CM | POA: Diagnosis not present

## 2020-05-09 DIAGNOSIS — R4589 Other symptoms and signs involving emotional state: Secondary | ICD-10-CM

## 2020-05-09 DIAGNOSIS — F322 Major depressive disorder, single episode, severe without psychotic features: Secondary | ICD-10-CM

## 2020-05-09 NOTE — Progress Notes (Signed)
Spoke with patient via Webex video call, used 2 identifers to correctly identify patient. She is suppose to start Zoloft as soon as she can go pick it up, her PCP sent in a prescription to help with depression. She is depressed over family stressors. Groups are going OK, she finds it difficult to talk about her issues. Feels more comfortable talking to her therapist. On scale 1-10 as 10 being worst she rates depression at 8 and anxiety at 5. Denies SI/HI or AV hallucinations. No other issues or complaints.

## 2020-05-09 NOTE — Therapy (Signed)
[facility] PARTIAL HOSPITALIZATION PROGRAM [address] [place], [place], [address] Phone: [phone]   Fax:  [phone] Virtual Visit via Video Note  I connected with Nicole Snyder on 05/09/20 at  11:00 AM EST by a video enabled telemedicine application and verified that I am speaking with the correct person using two identifiers.  Location: Patient: Patient Home Provider: Clinic Office   I discussed the limitations of evaluation and management by telemedicine and the availability of in person appointments. The patient expressed understanding and agreed to proceed.   I discussed the assessment and treatment plan with the patient. The patient was provided an opportunity to ask questions and all were answered. The patient agreed with the plan and demonstrated an understanding of the instructions.   The patient was advised to call back or seek an in-person evaluation if the symptoms worsen or if the condition fails to improve as anticipated.  I provided 60 minutes of non-face-to-face time during this encounter.   Nicole Snyder, OT   Occupational Therapy Treatment  Patient Details  Name: Nicole Snyder MRN: [id number] Date of Birth: [birth year] Referring Provider (OT): Hillery Jacks   Encounter Date: 05/09/2020   OT End of Session - 05/09/20 1436    Visit Number 4    Number of Visits 20    Date for OT Re-Evaluation 05/29/20    Authorization Type BCBS    OT Start Time 1110    OT Stop Time 1210    OT Time Calculation (min) 60 min    Activity Tolerance Patient limited by fatigue    Behavior During Therapy Flat affect           Past Medical History:  Diagnosis Date  . Asthma   . Asthma, mild persistent 02/04/2011  . Atopic dermatitis 02/04/2011  . Constipation   . Cough   . Rhinitis     History reviewed. No pertinent surgical history.  There were no vitals filed for this visit.   Subjective Assessment - 05/09/20 1435     Currently in Pain? No/denies            OT Education - 05/09/20 1436    Education Details Educated on different factors that contribute to our ability to manage our time, along with specific time management tips/strategies, including procrastination    Person(s) Educated Patient    Methods Explanation;Handout    Comprehension Verbalized understanding            OT Short Term Goals - 05/02/20 1447      OT SHORT TERM GOAL #1   Status On-going      OT SHORT TERM GOAL #2   Status On-going      OT SHORT TERM GOAL #3   Status On-going      OT SHORT TERM GOAL #4   Status On-going         Group Session:  S: None noted*  O: Group began with a warm-up ice breaker activity where patients were encouraged to reflect on the scenario "If you had 86,400 dollars dropped into your bank account at midnight and 24 hours to spend it, what you use the money for? The only rule was that any money not used disappeared after 24 hours." Warm-up activity was used as an Web designer and a way to connect that similar to the scenario of money, we do not get time back and there are 86,400 seconds in a day. Warm-up transitioned into today's group  focused on topic of Time Management. Group members identified ways in which they currently struggle with managing their time and discussion focused on alternative strategies to recognize how we can be more productive and intentional with our time and managing it appropriately. Group members also discussed specific strategies to overcome procrastination.   A: Nicole Snyder was minimally engaged in her participation of group discussion and activity. Pt did not have her camera on, however acknowledged verbally that she was present in group. Pt did not participate despite cues from group clinician, however remained signed on. Treatment team relayed message that patient reports limited benefit from group at this time - will follow up with patient in the morning regarding next steps,  however will continue to encourage and promote engagement in group sessions.   P: Continue to attend PHP OT group sessions 5x week for 2 weeks to promote daily structure, social engagement, and opportunities to develop and utilize adaptive strategies to maximize functional performance in preparation for safe transition and integration back into school, work, and the community. Plan to continue to address topic of time management in next OT group session.   Plan - 05/09/20 1436    Occupational performance deficits (Please refer to evaluation for details): ADL's;IADL's;Rest and Sleep;Education;Work;Leisure;Social Participation    Body Structure / Function / Physical Skills ADL    Cognitive Skills Attention;Consciousness;Emotional;Energy/Drive;Learn;Memory;Orientation;Problem Solve;Perception;Safety Awareness;Temperament/Personality;Thought;Understand    Psychosocial Skills Coping Strategies;Environmental  Adaptations;Habits;Interpersonal Interaction;Routines and Behaviors           Patient will benefit from skilled therapeutic intervention in order to improve the following deficits and impairments:   Body Structure / Function / Physical Skills: ADL Cognitive Skills: Attention,Consciousness,Emotional,Energy/Drive,Learn,Memory,Orientation,Problem Solve,Perception,Safety Awareness,Temperament/Personality,Thought,Understand Psychosocial Skills: Coping Strategies,Environmental  Adaptations,Habits,Interpersonal Interaction,Routines and Behaviors   Visit Diagnosis: Difficulty coping  Frontal lobe and executive function deficit  Borderline personality disorder [facility])    Problem List Patient Active Problem List   Diagnosis Date Noted  . Well adolescent visit 05/29/2017  . Folliculitis of both axillae 03/01/2017  . Insomnia 09/19/2016  . Borderline personality disorder (HCC) 03/08/2016  . Mood disorder (HCC) 09/27/2015  . Anxiety 09/02/2015  . Wheezing 10/05/2014  . BMI (body mass index),  pediatric, 85% to less than 95% for age 21/18/2016  . Asthma, mild persistent 02/04/2011  . Atopic dermatitis 02/04/2011  . Environmental allergies 01/30/2011  . Pollen allergies 01/30/2011  . Cat allergies 01/30/2011   05/09/2020  Nicole Snyder, MOT, OTR/L  05/09/2020, 2:37 PM  [facility] PARTIAL HOSPITALIZATION PROGRAM [address] [place], [place], [address] Phone: [phone]   Fax:  [phone]  Name: Nicole Snyder MRN: [id number] Date of Birth: 2000/07/15

## 2020-05-09 NOTE — Progress Notes (Signed)
Virtual Visit via Telephone Note  I connected with Nicole Snyder on 05/09/20 at  9:00 AM EST by telephone and verified that I am speaking with the correct person using two identifiers.  Location: Patient: home Provider: home   I discussed the limitations, risks, security and privacy concerns of performing an evaluation and management service by telephone and the availability of in person appointments. I also discussed with the patient that there may be a patient responsible charge related to this service. The patient expressed understanding and agreed to proceed.   I discussed the assessment and treatment plan with the patient. The patient was provided an opportunity to ask questions and all were answered. The patient agreed with the plan and demonstrated an understanding of the instructions.   The patient was advised to call back or seek an in-person evaluation if the symptoms worsen or if the condition fails to improve as anticipated.  I provided 15 minutes of non-face-to-face time during this encounter.   Nicole Rack, NP   BH MD/PA/NP OP Progress Note  05/09/2020 9:30 PM Nicole Snyder  MRN:  [id number]  Evaluation: Nicole Snyder was seen and evaluated via WebEx.  She is awake, alert and oriented x3.  She presents with a flat and guarded affect.  Rating her depression 7 out of 10 with 10 being the worst.  Denying suicidal or homicidal ideations.  Denies auditory or visual hallucinations.  Nicole Snyder is very minimal throughout this assessment.  Patient reported apprehension with starting antidepressant.  Additional education was provided with antidepressant that its effect on mood.  Patient appeared receptive.  Nicole Snyder reports she will consider taking Zoloft however states she is okay with only taking gabapentin at this time. Patient reported recent tooth extraction. Stating mild pain.  Reports a fair appetite.  States she is resting well throughout the night.  Support, encouragement and  reassurance was provided   Visit Diagnosis: No diagnosis found.  Past Psychiatric History:   Past Medical History:  Past Medical History:  Diagnosis Date  . Asthma   . Asthma, mild persistent 02/04/2011  . Atopic dermatitis 02/04/2011  . Constipation   . Cough   . Rhinitis    No past surgical history on file.  Family Psychiatric History:   Family History:  Family History  Problem Relation Age of Onset  . Heart disease Father   . Hyperlipidemia Father   . Hypertension Father   . Asthma Father   . Depression Father   . Heart disease Maternal Grandmother   . Cancer Maternal Grandmother        breeast  . Stroke Maternal Grandmother   . Hypertension Maternal Grandmother   . Depression Maternal Grandmother   . Hearing loss Maternal Grandfather   . Heart disease Paternal Grandmother   . Hyperlipidemia Paternal Grandmother   . Hypertension Paternal Grandmother   . Heart disease Paternal Grandfather   . Hyperlipidemia Paternal Grandfather   . Hypertension Paternal Grandfather   . Depression Mother   . Asthma Maternal Aunt   . Asthma Paternal Aunt   . Alcohol abuse Neg Hx   . Arthritis Neg Hx   . Birth defects Neg Hx   . COPD Neg Hx   . Diabetes Neg Hx   . Drug abuse Neg Hx   . Early death Neg Hx   . Kidney disease Neg Hx   . Learning disabilities Neg Hx   . Mental illness Neg Hx   . Mental retardation Neg Hx   .  Miscarriages / Stillbirths Neg Hx   . Vision loss Neg Hx   . Varicose Veins Neg Hx     Social History:  Social History   Socioeconomic History  . Marital status: Single    Spouse name: Not on file  . Number of children: 0  . Years of education: Not on file  . Highest education level: Not on file  Occupational History  . Not on file  Tobacco Use  . Smoking status: Never Smoker  . Smokeless tobacco: Never Used  Vaping Use  . Vaping Use: Never used  Substance and Sexual Activity  . Alcohol use: No  . Drug use: No  . Sexual activity: Never     Birth control/protection: Abstinence  Other Topics Concern  . Not on file  Social History Narrative   12th grade at [employer] daily, goes to gym   Works at [employer]: Not on [employer] Insecurity: Not on file  Transportation Needs: Not on file  Physical Activity: Not on file  Stress: Not on file  Social Connections: Not on file    Allergies:  Allergies  Allergen Reactions  . Mold Extract [Trichophyton Mentagrophyte]   . Pollen Extract-Tree Extract     Metabolic Disorder Labs: No results found for: HGBA1C, MPG No results found for: PROLACTIN No results found for: CHOL, TRIG, HDL, CHOLHDL, VLDL, LDLCALC No results found for: TSH  Therapeutic Level Labs: No results found for: LITHIUM No results found for: VALPROATE No components found for:  CBMZ  Current Medications: Current Outpatient Medications  Medication Sig Dispense Refill  . cetirizine (ZYRTEC) 10 MG tablet Take 10 mg by mouth daily.    [name] EPIPEN 2-PAK 0.3 MG/0.3ML DEVI Inject 0.3 mg into the muscle as needed (for allergic reaction).     . fluticasone (FLONASE) 50 MCG/ACT nasal spray USE 2 SPRAYS IN EACH NOSTRIL ONCE A DAY 16 g 1  . gabapentin (NEURONTIN) 300 MG capsule TAKE 3 CAPSULES TWICE A DAY 540 capsule 0  . traZODone (DESYREL) 50 MG tablet Take 50 mg by mouth at bedtime.    . triamcinolone cream (KENALOG) 0.5 % APPLY TO AFFECTED AREA TWICE A DAY 60 g 1  . hydrOXYzine (ATARAX/VISTARIL) 10 MG tablet TAKE 1 TO 3 TABLETS 3 TIMES A DAY (Patient not taking: No sig reported) 90 tablet 0  . montelukast (SINGULAIR) 10 MG tablet TAKE 1 TABLET AT BEDTIME (Patient not taking: No sig reported) 30 tablet 0  . mupirocin ointment (BACTROBAN) 2 % Apply 1 application topically 2 (two) times daily. (Patient not taking: No sig reported) 22 g 0   No current facility-administered medications for this visit.     Musculoskeletal: Strength &  Muscle Tone: within normal limits Gait & Station: normal Patient leans: N/A  Psychiatric Specialty Exam: Review of Systems  There were no vitals taken for this visit.There is no height or weight on file to calculate BMI.  General Appearance: Guarded  Eye Contact:  Good  Speech:  Clear and Coherent  Volume:  Decreased  Mood:  Depressed  Affect:  Appropriate and Depressed  Thought Process:  Coherent  Orientation:  Full (Time, Place, and Person)  Thought Content: Logical   Suicidal Thoughts:  No  Homicidal Thoughts:  No  Memory:  Immediate;   Good Recent;   Good  Judgement:  Fair  Insight:  Fair  Psychomotor Activity:  Normal  Concentration:  Concentration: Fair  Recall:  [employer] of Knowledge: Fair  Language: Fair  Akathisia:  No  Handed:  Right  AIMS (if indicated):   Assets:  Communication Skills Desire for Improvement Social Support  ADL's:  Intact  Cognition: WNL  Sleep:  Fair   Screenings: Administrator, sports from 05/01/2020 [facility]PITAL[facility] PROGRAM Office Visit from 06/01/2018 in [facility] Office Visit from 05/29/2017 in [facility] Office Visit from 10/04/2015 in Primary Care at [facility] Visit from 09/28/2015 in [facility]  PHQ-2 Total Score 4 4 6 2 2   PHQ-9 Total Score 18 24 27 12 3        Assessment and Plan:  Nicha to continue partial hospitalization programming Consider initiating antidepressant i.e. Zoloft, Prozac and/or Paxil -Continue gabapentin 300 mg po BID as indicated  Treatment plan was reviewed and agreed upon by NP T. and patient Nicole Snyder need for continued group services   Melvyn Neth, NP 05/09/2020, 9:30 PM

## 2020-05-10 ENCOUNTER — Other Ambulatory Visit (HOSPITAL_COMMUNITY): Payer: BC Managed Care – PPO

## 2020-05-10 ENCOUNTER — Telehealth (HOSPITAL_COMMUNITY): Payer: Self-pay | Admitting: Licensed Clinical Social Worker

## 2020-05-10 ENCOUNTER — Other Ambulatory Visit: Payer: Self-pay

## 2020-05-11 ENCOUNTER — Other Ambulatory Visit (HOSPITAL_COMMUNITY): Payer: BC Managed Care – PPO | Admitting: Occupational Therapy

## 2020-05-11 ENCOUNTER — Other Ambulatory Visit (HOSPITAL_COMMUNITY): Payer: BC Managed Care – PPO | Admitting: Licensed Clinical Social Worker

## 2020-05-11 ENCOUNTER — Encounter (HOSPITAL_COMMUNITY): Payer: Self-pay | Admitting: Family

## 2020-05-11 ENCOUNTER — Encounter (HOSPITAL_COMMUNITY): Payer: Self-pay

## 2020-05-11 ENCOUNTER — Other Ambulatory Visit: Payer: Self-pay

## 2020-05-11 DIAGNOSIS — F411 Generalized anxiety disorder: Secondary | ICD-10-CM | POA: Diagnosis not present

## 2020-05-11 DIAGNOSIS — F603 Borderline personality disorder: Secondary | ICD-10-CM

## 2020-05-11 DIAGNOSIS — R41844 Frontal lobe and executive function deficit: Secondary | ICD-10-CM

## 2020-05-11 DIAGNOSIS — R4589 Other symptoms and signs involving emotional state: Secondary | ICD-10-CM

## 2020-05-11 DIAGNOSIS — F332 Major depressive disorder, recurrent severe without psychotic features: Secondary | ICD-10-CM

## 2020-05-11 NOTE — Psych (Signed)
Virtual Visit via Video Note  I connected with Nicole Snyder on 05/01/20 at  9:00 AM EST by a video enabled telemedicine application and verified that I am speaking with the correct person using two identifiers.  Location: Patient: patient home Provider: clinical home office   I discussed the limitations of evaluation and management by telemedicine and the availability of in person appointments. The patient expressed understanding and agreed to proceed.  I discussed the assessment and treatment plan with the patient. The patient was provided an opportunity to ask questions and all were answered. The patient agreed with the plan and demonstrated an understanding of the instructions.   The patient was advised to call back or seek an in-person evaluation if the symptoms worsen or if the condition fails to improve as anticipated.  Pt and cln completed treatment plan and pt reports alignment. Pt provides verbal consent to treatment and group commitments.   I provided 10 minutes of non-face-to-face time during this encounter.   [name], LCSW

## 2020-05-11 NOTE — Therapy (Signed)
[facility] PARTIAL HOSPITALIZATION PROGRAM [address] [place], [place], [address] Phone: [phone]   Fax:  [phone] Virtual Visit via Video Note  I connected with Nicole Snyder on 05/11/20 at  11:00 AM EST by a video enabled telemedicine application and verified that I am speaking with the correct person using two identifiers.  Location: Patient: Patient Home Provider: Clinic Office    I discussed the limitations of evaluation and management by telemedicine and the availability of in person appointments. The patient expressed understanding and agreed to proceed.   I discussed the assessment and treatment plan with the patient. The patient was provided an opportunity to ask questions and all were answered. The patient agreed with the plan and demonstrated an understanding of the instructions.   The patient was advised to call back or seek an in-person evaluation if the symptoms worsen or if the condition fails to improve as anticipated.  I provided 65 minutes of non-face-to-face time during this encounter.   Nicole Snyder, OT   Occupational Therapy Treatment  Patient Details  Name: Nicole Snyder MRN: [id number] Date of Birth: 2000-11-16 Referring Provider (OT): Nicole Snyder   Encounter Date: 05/11/2020   OT End of Session - 05/11/20 1222    Visit Number 5    Number of Visits 20    Date for OT Re-Evaluation 05/29/20    Authorization Type BCBS    OT Start Time 1105    OT Stop Time 1210    OT Time Calculation (min) 65 min    Activity Tolerance Patient tolerated treatment well    Behavior During Therapy Flat affect           Past Medical History:  Diagnosis Date  . Asthma   . Asthma, mild persistent 02/04/2011  . Atopic dermatitis 02/04/2011  . Constipation   . Cough   . Rhinitis     History reviewed. No pertinent surgical history.  There were no vitals filed for this visit.   Subjective Assessment - 05/11/20 1221     Currently in Pain? No/denies    Multiple Pain Sites No           OT Education - 05/11/20 1221    Education Details Educated on safety planning and community mental health resources    Person(s) Educated Patient    Methods Explanation;Handout    Comprehension Verbalized understanding            OT Short Term Goals - 05/02/20 1447      OT SHORT TERM GOAL #1   Status On-going      OT SHORT TERM GOAL #2   Status On-going      OT SHORT TERM GOAL #3   Status On-going      OT SHORT TERM GOAL #4   Status On-going         Group Session:   S: "I could be walking down the hallway and get like this surge and my mood shifts so quick. That's when I know something could happen."   O: Today's group discussion focused on the topic of Safety Planning. Patients were educated on what a safety plan is, what it can be used for, and why we should create one. Group then worked collaboratively and independently to create an individualized safety plan including identifying warning signs, coping strategies, places to go for distraction, social supports, professional supports, and how to make the environment safe. Group members were also encouraged to reflect on  the question "What is life worth living for?" Group session ended with patients encouraged to utilize their safety plan and answered any questions/concerns going into the long holiday weekend.     A: [name] was active in her participation of discussion with moderate verbal cues from group clinician to engage. Pt continues to present with the camera off for the duration of session, however was verbally engaged and participated more than noted in previous two sessions.  Pt identified one of her warning signs as "mood swings" and a coping skill to combat this as going for a run or reading. She identified distractions for her as walking on the trail near her house and Mom or older sister. She identified seeking help or support by reaching out to Mom  or her older sister. Pt was more engaged than noted in previous sessions, however will continue to encourage and promote active engagement in group sessions.    P: Continue to attend PHP OT group sessions 5x week for 2 weeks to promote daily structure, social engagement, and opportunities to develop and utilize adaptive strategies to maximize functional performance in preparation for safe transition and integration back into school, work, and the community. Plan to address topic of stress management in next OT group session.   Plan - 05/11/20 1222    Occupational performance deficits (Please refer to evaluation for details): ADL's;IADL's;Rest and Sleep;Education;Work;Leisure;Social Participation    Body Structure / Function / Physical Skills ADL    Cognitive Skills Attention;Consciousness;Emotional;Energy/Drive;Learn;Memory;Orientation;Problem Solve;Perception;Safety Awareness;Temperament/Personality;Thought;Understand    Psychosocial Skills Coping Strategies;Environmental  Adaptations;Habits;Interpersonal Interaction;Routines and Behaviors           Patient will benefit from skilled therapeutic intervention in order to improve the following deficits and impairments:   Body Structure / Function / Physical Skills: ADL Cognitive Skills: Attention,Consciousness,Emotional,Energy/Drive,Learn,Memory,Orientation,Problem Solve,Perception,Safety Awareness,Temperament/Personality,Thought,Understand Psychosocial Skills: Coping Strategies,Environmental  Adaptations,Habits,Interpersonal Interaction,Routines and Behaviors   Visit Diagnosis: Difficulty coping  Frontal lobe and executive function deficit  Borderline personality disorder [facility])    Problem List Patient Active Problem List   Diagnosis Date Noted  . Well adolescent visit 05/29/2017  . Folliculitis of both axillae 03/01/2017  . Insomnia 09/19/2016  . Borderline personality disorder (HCC) 03/08/2016  . Mood disorder (HCC) 09/27/2015  .  Anxiety 09/02/2015  . Wheezing 10/05/2014  . BMI (body mass index), pediatric, 85% to less than 95% for age 02/05/2015  . Asthma, mild persistent 02/04/2011  . Atopic dermatitis 02/04/2011  . Environmental allergies 01/30/2011  . Pollen allergies 01/30/2011  . Cat allergies 01/30/2011    05/11/2020  Nicole Snyder, MOT, OTR/L  05/11/2020, 12:22 PM  [facility] HOSPITALIZATION PROGRAM [address] [place], [place], [address] Phone: [phone]   Fax:  [phone]  Name: Nicole Snyder MRN: [id number] Date of Birth: Aug 05, 2000

## 2020-05-11 NOTE — Psych (Signed)
Virtual Visit via Video Note  I connected with Nicole Snyder on 05/01/20 at  9:00 AM EST by a video enabled telemedicine application and verified that I am speaking with the correct person using two identifiers.  Location: Patient: patient home Provider: clinical home office   I discussed the limitations of evaluation and management by telemedicine and the availability of in person appointments. The patient expressed understanding and agreed to proceed.  I discussed the assessment and treatment plan with the patient. The patient was provided an opportunity to ask questions and all were answered. The patient agreed with the plan and demonstrated an understanding of the instructions.   The patient was advised to call back or seek an in-person evaluation if the symptoms worsen or if the condition fails to improve as anticipated.  Pt was provided 240 minutes of non-face-to-face time during this encounter.   [name], LCSW    [facility] PHP THERAPIST PROGRESS NOTE  Nicole Snyder [id number]  Session Time: 9:00 - 10:00  Participation Level: Active  Behavioral Response: CasualAlertAnxious and Depressed  Type of Therapy: Group Therapy  Treatment Goals addressed: Coping  Interventions: CBT, DBT, Supportive and Reframing  Summary: Clinician led check-in regarding current stressors and situation, and review of patient completed daily inventory. Clinician utilized active listening and empathetic response and validated patient emotions. Clinician facilitated processing group on pertinent issues.   Therapist Response: Nicole Snyder is a 19 y.o. female who presents with depression and anxiety symptoms. Patient arrived within time allowed and reports that she is feeling "just good." Patient rates hermood at [facility] on a scale of 1-10 with 10 being great. Pt reports worked all weekend and it was "fine" however she is easily annoyed by her coworkers. Pt states difficulty falling  asleep. Pt reports struggles with mood swings. Pt able to process. Pt engaged in discussion.      Session Time: 10:00 - 11:00   Participation Level:Active  Behavioral Response:CasualAlertDepressed  Type of Therapy:GroupTherapy  Treatment Goals addressed: Coping  Interventions:CBT, DBT, Supportive and Reframing  Summary: Cln led discussion on impulsivity. Group discussed struggles with impulsivity. Cln highlighted theme of immediacy. Group built insight around the way immediacy interacts with impulsivity. Cln encouraged pt's to consider mantras and grounding statements to remind themselves there is time to think/feel/act.   Therapist Response: Pt engaged in discussion and is able to make connections and gain insight.      Session Time: 11:00- 12:00  Participation Level:Active  Behavioral Response:CasualAlertDepressed  Type of Therapy: Group Therapy, OT  Treatment Goals addressed: Coping  Interventions:Psychosocial skills training, Supportive  Summary:Occupational Therapy group  Therapist Response:Patient engaged in group. See OT note.     Session Time: 12:00 -1:00  Participation Level:Active  Behavioral Response:CasualAlertDepressed  Type of Therapy:Group therapy  Treatment Goals addressed: Coping  Interventions:CBT; Solution focused; Supportive; Reframing  Summary:12:00 - 12:50:Cln introduced topic of CBT cognitive distortions. Cln discussed unhealthy thought patterns and how our thoughts shape our reality and irrational thoughts can alter our perspective.  12:50 -1:00 Clinician led check-out. Clinician assessed for immediate needs, medication compliance and efficacy, and safety concerns  Therapist Response:12:00 - 12:50: Pt enaged in discussion and is able to determine examples of distorted thinking in their own life.  12:50 - 1:00: At check-out, patientrates hermood at [facility] on a scale of 1-10 with 10 being  great.Pt reports afternoon plans ofchores and Christmas shopping. Pt demonstrates some progress as evidenced by participating in first group session. Patient denies SI/HI  at the end of group.   Suicidal/Homicidal: Nowithout intent/plan   Plan: Pt will continue in PHP while working to decrease depression and anxiety symptoms, increase ability to mange symptoms in a healthy manner, and increase emotional regulation.   Diagnosis: Severe episode of recurrent major depressive disorder, without psychotic features (HCC) [F33.2]    1. Severe episode of recurrent major depressive disorder, without psychotic features (HCC)       [name], LCSW 05/11/2020

## 2020-05-16 ENCOUNTER — Other Ambulatory Visit: Payer: Self-pay

## 2020-05-16 ENCOUNTER — Other Ambulatory Visit (HOSPITAL_COMMUNITY): Payer: BC Managed Care – PPO | Admitting: Licensed Clinical Social Worker

## 2020-05-16 ENCOUNTER — Other Ambulatory Visit (HOSPITAL_COMMUNITY): Payer: BC Managed Care – PPO | Admitting: Specialist

## 2020-05-16 DIAGNOSIS — F332 Major depressive disorder, recurrent severe without psychotic features: Secondary | ICD-10-CM

## 2020-05-16 DIAGNOSIS — R4589 Other symptoms and signs involving emotional state: Secondary | ICD-10-CM

## 2020-05-16 DIAGNOSIS — F411 Generalized anxiety disorder: Secondary | ICD-10-CM | POA: Diagnosis not present

## 2020-05-16 DIAGNOSIS — F603 Borderline personality disorder: Secondary | ICD-10-CM

## 2020-05-16 DIAGNOSIS — R41844 Frontal lobe and executive function deficit: Secondary | ICD-10-CM

## 2020-05-16 NOTE — Progress Notes (Signed)
Spoke with patient via Webex video call, used 2 identifiers to correctly identify patient. States she is finding it hard to focus in group, easily bored and can't get into them. She goes back to school tomorrow and will continue seeing her therapist. She does not have a follow up appointment yet. Started Zoloft last week, no side effects that she noted. Started feeing depressed yesterday and today. Unable to pinpoint a reason. "I will just deal with it." PHQ9=21. On scale 1-10 as 10 being worst she rates depression at 7 and anxiety at 0. Denies SI/HI or AV hallucinations. No issues or complaints.

## 2020-05-17 ENCOUNTER — Other Ambulatory Visit: Payer: Self-pay

## 2020-05-17 ENCOUNTER — Other Ambulatory Visit (HOSPITAL_COMMUNITY): Payer: BC Managed Care – PPO | Admitting: Licensed Clinical Social Worker

## 2020-05-17 ENCOUNTER — Other Ambulatory Visit (HOSPITAL_COMMUNITY): Payer: BC Managed Care – PPO

## 2020-05-17 ENCOUNTER — Encounter (HOSPITAL_COMMUNITY): Payer: Self-pay

## 2020-05-17 DIAGNOSIS — F603 Borderline personality disorder: Secondary | ICD-10-CM

## 2020-05-17 DIAGNOSIS — F411 Generalized anxiety disorder: Secondary | ICD-10-CM | POA: Diagnosis not present

## 2020-05-17 DIAGNOSIS — F322 Major depressive disorder, single episode, severe without psychotic features: Secondary | ICD-10-CM

## 2020-05-17 NOTE — Progress Notes (Signed)
Virtual Visit via Video Note  I connected with Nicole Snyder on 05/17/20 at  9:00 AM EST by a video enabled telemedicine application and verified that I am speaking with the correct person using two identifiers.  Location: Patient: Home Provider: Home   I discussed the limitations of evaluation and management by telemedicine and the availability of in person appointments. The patient expressed understanding and agreed to proceed.   I discussed the assessment and treatment plan with the patient. The patient was provided an opportunity to ask questions and all were answered. The patient agreed with the plan and demonstrated an understanding of the instructions.   The patient was advised to call back or seek an in-person evaluation if the symptoms worsen or if the condition fails to improve as anticipated.  I provided 15 minutes of non-face-to-face time during this encounter.   Nicole Rack, NP   [facility] Program Discharge Summary  Nicole Snyder [id number]  Admission date: 05/02/2020 Discharge date: 05/18/2020  Reason for admission: per admission assessment note: Nicole Snyder is a 19 y.o. Caucasian female presents with depression and suicidal ideaitons.Patient reports she is currently followed by therapy and psychiatry. Currently in treatment with Nicole Snyder and Nicole Snyder Psychiatrist . Nicole Snyder states she is prescribed Gabapentin 800 mg and no longer takes hydroxyzine at this time. Reports diagnosis with depression and  generalized anxiety disorder. Nicole Snyder stated that she noticed her anxiety and depression has gotten worse due to returning back to school in person. Nicole Snyder reported she is attending [employer] with a undeclared major. Reported that her anxiety  has been "hard to deal with " in the past.  Reports she is unsure of her major currently.  Nicole Snyder reports a history of self injurious behaviors cutting " as a coping  skill".  Nicole Snyder reported struggling with depression, feelings of worthlessness and mood irritabilty.  Patient reported that she is currently employed and enjoys her job. patient was enrolled in partial psychiatric program on 05/02/20.    Progress in Program Toward Treatment Goals: Ongoing, Nicole Snyder attended and participated when prompted with daily group session with minimal engagement.  She denied suicidal or homicidal ideations.  Denies auditory or visual hallucinations.  Reports follow-up appointment with outpatient providers declined any medication refills at this time. Stated she was recently started on Zoloft by her psychiatrist.  Patient continues to present  guarded, flat and depressed during nurse practitioners assessments.  Patient rates her depression 4 out of 10 with 10 being the worst.  States she continues to struggle with mild anxiety related to going back to campus.  Encouraged utilization of coping skills.  Patient was receptive to plan.  Support, encouragement and reassurance was provided.  Progress (rationale): Keep follow-up appointment with psychiatrist Nicole Snyder and Nicole Snyder  Take all medications as prescribed. Keep all follow-up appointments as scheduled.  Do not consume alcohol or use illegal drugs while on prescription medications. Report any adverse effects from your medications to your primary care provider promptly.  In the event of recurrent symptoms or worsening symptoms, call 911, a crisis hotline, or go to the nearest emergency department for evaluation.    Nicole Rack, NP 05/17/2020

## 2020-05-17 NOTE — Therapy (Addendum)
[place], [place], [address] Phone: [phone]   Fax:  [phone] Virtual Visit via Video Note  I connected with Nicole Snyder on 05/17/20 at  11:00 AM EST by a video enabled telemedicine application and verified that I am speaking with the correct person using two identifiers.  Location: Patient: home Provider: [address], [place], office    I discussed the limitations of evaluation and management by telemedicine and the availability of in person appointments. The patient expressed understanding and agreed to proceed.   I discussed the assessment and treatment plan with the patient. The patient was provided an opportunity to ask questions and all were answered. The patient agreed with the plan and demonstrated an understanding of the instructions.   The patient was advised to call back or seek an in-person evaluation if the symptoms worsen or if the condition fails to improve as anticipated.  I provided 50 minutes of non-face-to-face time during this encounter.      Occupational Therapy Treatment  Patient Details  Name: Nicole Snyder MRN: [id number] Date of Birth: Mar 26, 2001 Referring Provider (OT): Ricky Ala   Encounter Date: 05/16/2020   OT End of Session - 05/17/20 0854    Visit Number 6    Number of Visits 20    Date for OT Re-Evaluation 05/29/20    Authorization Type BCBS    OT Start Time 1100    OT Stop Time 1150    OT Time Calculation (min) 50 min    Activity Tolerance Patient tolerated treatment well    Behavior During Therapy Flat affect           Past Medical History:  Diagnosis Date  . Asthma   . Asthma, mild persistent 02/04/2011  . Atopic dermatitis 02/04/2011  . Constipation   . Cough   . Rhinitis     History reviewed. No pertinent surgical history.  There were no vitals filed for this visit.   Subjective Assessment -  05/17/20 0854    Currently in Pain? No/denies          S:  I sleep 6 hours 0:  Patient minimally participated in skilled OT group focusing on improving sleep hygiene.  Patient was educated and discussed benefits of getting enough sleep, detriments of getting too much or too little sleep.  Patient and group discussed strategies for improving sleep including routines, environmental factors, diet, exercises, calming activities.  Patient was educated on use of sleep diary to identify current sleep patterns and strategies to improve sleep routine.   A:  Patient committed to changing her diet to help with her sleep. P:  Continue skilled OT intervention and next session to focus on communication skills.   Vangie Bicker, MHA, OTR/L [phone]   OCCUPATIONAL THERAPY DISCHARGE SUMMARY  Visits from Start of Care: 6  Current functional level related to goals / functional outcomes: Pt has achieved 2/4 of her OT goals within the [facility] program and will be discharged at the request of patient.    Remaining deficits: See above   Education / Equipment: See above   Plan: Patient agrees to discharge.  Patient goals were partially met. Patient is being discharged due to being pleased with the current functional level.  ?????                        OT Education - 05/17/20 3291  Education Details educated on [place] and donts of good sleep hygiene and use of sleep chart to track sleep habits    Person(s) Educated Patient    Methods Explanation;Handout    Comprehension Verbalized understanding            OT Short Term Goals - 05/02/20 1447      OT SHORT TERM GOAL #1   Status On-going      OT SHORT TERM GOAL #2   Status On-going      OT SHORT TERM GOAL #3   Status On-going      OT SHORT TERM GOAL #4   Status On-going                    Plan - 05/17/20 0855    Body Structure / Function / Physical Skills ADL    Cognitive Skills  Attention;Consciousness;Emotional;Energy/Drive;Learn;Memory;Orientation;Problem Solve;Perception;Safety Awareness;Temperament/Personality;Thought;Understand    Psychosocial Skills Coping Strategies;Environmental  Adaptations;Habits;Interpersonal Interaction;Routines and Behaviors           Patient will benefit from skilled therapeutic intervention in order to improve the following deficits and impairments:   Body Structure / Function / Physical Skills: ADL Cognitive Skills: Attention,Consciousness,Emotional,Energy/Drive,Learn,Memory,Orientation,Problem Solve,Perception,Safety Awareness,Temperament/Personality,Thought,Understand Psychosocial Skills: Coping Strategies,Environmental  Adaptations,Habits,Interpersonal Interaction,Routines and Behaviors   Visit Diagnosis: Difficulty coping  Frontal lobe and executive function deficit    Problem List Patient Active Problem List   Diagnosis Date Noted  . Well adolescent visit 05/29/2017  . Folliculitis of both axillae 03/01/2017  . Insomnia 09/19/2016  . Borderline personality disorder ([place]) 03/08/2016  . Mood disorder ([place]) 09/27/2015  . Anxiety 09/02/2015  . Wheezing 10/05/2014  . BMI (body mass index), pediatric, 85% to less than 95% for age 44/18/2016  . Asthma, mild persistent 02/04/2011  . Atopic dermatitis 02/04/2011  . Environmental allergies 01/30/2011  . Pollen allergies 01/30/2011  . Cat allergies 01/30/2011    [place] Ped 05/17/2020, 8:56 AM  [place], [place], [address] Phone: [phone]   Fax:  [phone]  Name: Nicole Snyder MRN: [id number] Date of Birth: March 19, 2001

## 2020-05-18 ENCOUNTER — Ambulatory Visit (HOSPITAL_COMMUNITY): Payer: BC Managed Care – PPO

## 2020-05-18 ENCOUNTER — Other Ambulatory Visit (HOSPITAL_COMMUNITY): Payer: BC Managed Care – PPO

## 2020-05-18 NOTE — Progress Notes (Signed)
Spiritual care group 05/17/2020   11:00-12:00  Group met via web-ex due to COVID-19 precautions.  Group facilitated by Simone Curia, MDiv, BCC   Group focused on topic of "self-care"  Patients engaged in facilitated discussion about topic.  Explored quotes related to self care and chose one which they agreed with and one which they disliked.  Engaged in discussion around quote choices and their experience / understanding of care for themselves.    Little did not have camera on during group.  Engaged minimally in group.  They responded briefly to chaplain prompts - noting that they did not notice any themes in their experience of the activity around self care.

## 2020-05-24 ENCOUNTER — Encounter (HOSPITAL_COMMUNITY): Payer: Self-pay | Admitting: Family

## 2020-05-30 NOTE — Psych (Signed)
Virtual Visit via Video Note  I connected with Nicole Snyder on 05/02/20 at  9:00 AM EST by a video enabled telemedicine application and verified that I am speaking with the correct person using two identifiers.  Location: Patient: patient home Provider: clinical home office   I discussed the limitations of evaluation and management by telemedicine and the availability of in person appointments. The patient expressed understanding and agreed to proceed.  I discussed the assessment and treatment plan with the patient. The patient was provided an opportunity to ask questions and all were answered. The patient agreed with the plan and demonstrated an understanding of the instructions.   The patient was advised to call back or seek an in-person evaluation if the symptoms worsen or if the condition fails to improve as anticipated.  Pt was provided 240 minutes of non-face-to-face time during this encounter.   [name], LCSW    [facility] PHP THERAPIST PROGRESS NOTE  Nicole Snyder [id number]  Session Time: 9:00 - 10:00  Participation Level: Active  Behavioral Response: CasualAlertAnxious and Depressed  Type of Therapy: Group Therapy  Treatment Goals addressed: Coping  Interventions: CBT, DBT, Supportive and Reframing  Summary: Clinician led check-in regarding current stressors and situation, and review of patient completed daily inventory. Clinician utilized active listening and empathetic response and validated patient emotions. Clinician facilitated processing group on pertinent issues.   Therapist Response: Nicole Snyder is a 20 y.o. female who presents with depression and anxiety symptoms. Patient arrived within time allowed and reports that she is feeling "no good." Patient rates hermood at a5 on a scale of 1-10 with 10 being great. Pt reports she had a "rough night" and experienced a mood swing with anger/irritability. Pt reports she did not use coping skills  and the mood negatively impacted her sleep. Pt struggles with willingness. Pt continues to present with flat affect and is guarded in behavior. Pt engaged in discussion.      Session Time: 10:00 - 11:00   Participation Level:Active  Behavioral Response:CasualAlertDepressed  Type of Therapy:GroupTherapy  Treatment Goals addressed: Coping  Interventions:CBT, DBT, Supportive and Reframing  Summary: Cln led discussion on unrealistic expectations and the ways expectations can alter perspective. Group members discussed feelings and situations that have occurred due to expectation and how to know if they are unrealistic. Cln brought in CBT thought challenging and reframing to aid discussion.   Therapist Response:Pt engaged in discussion and reports gaining insight.       Session Time: 11:00- 12:00  Participation Level:Active  Behavioral Response:CasualAlertDepressed  Type of Therapy: Group Therapy, OT  Treatment Goals addressed: Coping  Interventions:Psychosocial skills training, Supportive  Summary:Occupational Therapy group  Therapist Response:Patient engaged in group. See OT note.     Session Time: 12:00 -1:00  Participation Level:Active  Behavioral Response:CasualAlertDepressed  Type of Therapy:Group therapy  Treatment Goals addressed: Coping  Interventions:CBT; Solution focused; Supportive; Reframing  Summary:12:00 - 12:50:Cln continued topic of CBT cognitive distortions. Cln utilized handout "cognitive distortions" to discuss common examples of distorted thoughts and group members worked to identify examples in their own life.  12:50 -1:00 Clinician led check-out. Clinician assessed for immediate needs, medication compliance and efficacy, and safety concerns  Therapist Response:12:00 - 12:50: Pt enaged in discussion and reports catastrophizing as problematic for her.  12:50 - 1:00: At check-out, patientrates  hermood at a6 on a scale of 1-10 with 10 being great.Pt reports afternoon plans ofworking. Pt demonstrates some progress as evidenced by ability to identify  feelings. Patient denies SI/HI at the end of group.   Suicidal/Homicidal: Nowithout intent/plan   Plan: Pt will continue in PHP while working to decrease depression and anxiety symptoms, increase ability to mange symptoms in a healthy manner, and increase emotional regulation.   Diagnosis: Current severe episode of major depressive disorder without psychotic features, unspecified whether recurrent (HCC) [F32.2]    1. Current severe episode of major depressive disorder without psychotic features, unspecified whether recurrent (HCC)       [name], LCSW 05/30/2020

## 2020-06-03 NOTE — Psych (Signed)
Virtual Visit via Video Note  I connected with Nicole Snyder on 05/08/20 at  9:00 AM EST by a video enabled telemedicine application and verified that I am speaking with the correct person using two identifiers.  Location: Patient: patient home Provider: clinical home office   I discussed the limitations of evaluation and management by telemedicine and the availability of in person appointments. The patient expressed understanding and agreed to proceed.  I discussed the assessment and treatment plan with the patient. The patient was provided an opportunity to ask questions and all were answered. The patient agreed with the plan and demonstrated an understanding of the instructions.   The patient was advised to call back or seek an in-person evaluation if the symptoms worsen or if the condition fails to improve as anticipated.  Pt was provided 240 minutes of non-face-to-face time during this encounter.   [name], LCSW    [facility] PHP THERAPIST PROGRESS NOTE  Nicole Snyder [id number]  Session Time: 9:00 - 10:00  Participation Level: Active  Behavioral Response: CasualAlertDepressed  Type of Therapy: Group Therapy  Treatment Goals addressed: Coping  Interventions: CBT, DBT, Supportive and Reframing  Summary: Clinician led check-in regarding current stressors and situation, and review of patient completed daily inventory. Clinician utilized active listening and empathetic response and validated patient emotions. Clinician facilitated processing group on pertinent issues.   Therapist Response: Nicole Snyder is a 20 y.o. female who presents with depression and anxiety symptoms. Patient arrived within time allowed and reports that she is feeling "excited." Patient rates hermood at [facility] on a scale of 1-10 with 10 being great. Pt reports she worked all weekend and it was "fine." Pt reports her sister is coming into town today for the holidays and pt is looking  forward to it. Pt exhibits more animation in behaviors today and continues to be short and brief in answers. Pt engaged in discussion.      Session Time: 10:00 - 11:00   Participation Level:Active  Behavioral Response:CasualAlertDepressed  Type of Therapy:GroupTherapy  Treatment Goals addressed: Coping  Interventions:CBT, DBT, Supportive and Reframing  Summary: Cln led discussion on change. Group members shared struggles they have with change and the way it is impacting them currently. Cln offered CBT reframing as a way to consider change differently and group worked to reframe each other's perspectives.   Therapist Response: Pt engaged in discussion and is able to identify one reframing option for change.       Session Time: 11:00- 12:00  Participation Level:Active  Behavioral Response:CasualAlertDepressed  Type of Therapy: Group Therapy, OT  Treatment Goals addressed: Coping  Interventions:Psychosocial skills training, Supportive  Summary:Occupational Therapy group  Therapist Response:Patient engaged in group. See OT note.      Session Time: 12:00 -1:00  Participation Level:Active  Behavioral Response:CasualAlertDepressed  Type of Therapy:Group therapy  Treatment Goals addressed: Coping  Interventions:CBT; Solution focused; Supportive; Reframing  Summary:12:00 - 12:50: Cln led discussion on control and the way it impacts our lives. Group members shared struggles and worked to identify the way in which control is contributing to the struggle. Cln utilized CBT thought challenging and the Catch-Challenge-Change model to address their control issues.  12:50 -1:00 Clinician led check-out. Clinician assessed for immediate needs, medication compliance and efficacy, and safety concerns  Therapist Response:12:00 - 12:50: Pt engaged in discussion and is able to determine ways in which control is an issue for her however  struggled to determine how to address it in the  future. 12:50 - 1:00: At check-out, patientrates hermood at [facility] on a scale of 1-10 with 10 being great.Pt reports afternoon plans ofspending time with her sister. Pt demonstrates some progress as evidenced by increased mood. Patient denies SI/HI at the end of group.   Suicidal/Homicidal: Nowithout intent/plan   Plan: Pt will continue in PHP while working to decrease depression and anxiety symptoms, increase ability to mange symptoms in a healthy manner, and increase emotional regulation.   Diagnosis: Severe episode of recurrent major depressive disorder, without psychotic features (HCC) [F33.2]    1. Severe episode of recurrent major depressive disorder, without psychotic features (HCC)   2. Borderline personality disorder (HCC)       [name], LCSW 06/03/2020

## 2020-06-03 NOTE — Psych (Signed)
Virtual Visit via Video Note  I connected with Nicole Snyder on 05/09/20 at  9:00 AM EST by a video enabled telemedicine application and verified that I am speaking with the correct person using two identifiers.  Location: Patient: patient home Provider: clinical home office   I discussed the limitations of evaluation and management by telemedicine and the availability of in person appointments. The patient expressed understanding and agreed to proceed.  I discussed the assessment and treatment plan with the patient. The patient was provided an opportunity to ask questions and all were answered. The patient agreed with the plan and demonstrated an understanding of the instructions.   The patient was advised to call back or seek an in-person evaluation if the symptoms worsen or if the condition fails to improve as anticipated.  Pt was provided 240 minutes of non-face-to-face time during this encounter.   [name], LCSW    [facility] PHP THERAPIST PROGRESS NOTE  Nicole Snyder [id number]  Session Time: 9:00 - 10:00  Participation Level: Active  Behavioral Response: CasualAlertDepressed  Type of Therapy: Group Therapy  Treatment Goals addressed: Coping  Interventions: CBT, DBT, Supportive and Reframing  Summary: Clinician led check-in regarding current stressors and situation, and review of patient completed daily inventory. Clinician utilized active listening and empathetic response and validated patient emotions. Clinician facilitated processing group on pertinent issues.   Therapist Response: Nicole Snyder is a 20 y.o. female who presents with depression and anxiety symptoms. Patient arrived within time allowed and reports that she is feeling "conflicted." Patient rates hermood at a2 on a scale of 1-10 with 10 being great. Pt reports she was triggered by a conversation with her cousin and it brought up family problems. Pt states she has been ruminating since  it happened. Pt reports decreased sleep due to rumination. Pt exhibits flat affect and guarded behaviors. Pt engaged in discussion.      Session Time: 10:00 - 11:00   Participation Level:Minimal  Behavioral Response:CasualAlertDepressed  Type of Therapy:GroupTherapy  Treatment Goals addressed: Coping  Interventions:CBT, DBT, Supportive and Reframing  Summary: Cln facilitated processing group around difficult relationships. Group members shared struggles they face in relationships. Cln brought in topics of self-esteem, CBT thought challenging, boundaries, and communication to aid growth.  Therapist Response: Pt engaged in discussion and shares relationship struggles with family dynamics. Pt initially states she wants to discuss her family conflict and then shuts down when given the opportunity. Pt did not engage past that.       Session Time: 11:00- 12:00  Participation Level:Active  Behavioral Response:CasualAlertDepressed  Type of Therapy: Group Therapy, OT  Treatment Goals addressed: Coping  Interventions:Psychosocial skills training, Supportive  Summary:Occupational Therapy group  Therapist Response:Patient engaged in group. See OT note.      Session Time: 12:00 -1:00  Participation Level:Active  Behavioral Response:CasualAlertDepressed  Type of Therapy:Group therapy  Treatment Goals addressed: Coping  Interventions:CBT; Solution focused; Supportive; Reframing  Summary:12:00 - 12:50: Cln introduced topic of DBT distress tolerance skills. Cln provided context for distress tolerance skills and how to practice them. Cln introduced the ACCEPTS distraction skills and group discussed ways to utilize A-C-C.  12:50 -1:00 Clinician led check-out. Clinician assessed for immediate needs, medication compliance and efficacy, and safety concerns  Therapist Response:12:00 - 12:50:  Pt engaged in discussion and states they can  watch tv to practice the "A" skill. 12:50 - 1:00: At check-out, patientrates hermood at a4 on a scale of 1-10 with 10 being great.Pt  reports afternoon plans ofgoing to work. Pt demonstrates some progress as evidenced by sharing a struggle. Patient denies SI/HI at the end of group.   Suicidal/Homicidal: Nowithout intent/plan   Plan: Pt will continue in PHP while working to decrease depression and anxiety symptoms, increase ability to mange symptoms in a healthy manner, and increase emotional regulation.   Diagnosis: Current severe episode of major depressive disorder without psychotic features, unspecified whether recurrent (HCC) [F32.2]    1. Current severe episode of major depressive disorder without psychotic features, unspecified whether recurrent (HCC)   2. Borderline personality disorder (HCC)       [name], LCSW 06/03/2020

## 2020-06-03 NOTE — Psych (Signed)
Virtual Visit via Video Note  I connected with Nicole Snyder on 05/16/20 at  9:00 AM EST by a video enabled telemedicine application and verified that I am speaking with the correct person using two identifiers.  Location: Patient: patient home Provider: clinical home office   I discussed the limitations of evaluation and management by telemedicine and the availability of in person appointments. The patient expressed understanding and agreed to proceed.  I discussed the assessment and treatment plan with the patient. The patient was provided an opportunity to ask questions and all were answered. The patient agreed with the plan and demonstrated an understanding of the instructions.   The patient was advised to call back or seek an in-person evaluation if the symptoms worsen or if the condition fails to improve as anticipated.  Pt was provided 240 minutes of non-face-to-face time during this encounter.   [name], LCSW    [facility] PHP THERAPIST PROGRESS NOTE  Nicole Snyder [id number]  Session Time: 9:00 - 10:00  Participation Level: Minimal  Behavioral Response: CasualAlertDepressed  Type of Therapy: Group Therapy  Treatment Goals addressed: Coping  Interventions: CBT, DBT, Supportive and Reframing  Summary: Clinician led check-in regarding current stressors and situation, and review of patient completed daily inventory. Clinician utilized active listening and empathetic response and validated patient emotions. Clinician facilitated processing group on pertinent issues.   Therapist Response: Nicole Snyder is a 20 y.o. female who presents with depression and anxiety symptoms. Patient arrived within time allowed and reports that she is feeling "kind of off." Patient rates hermood at a4 on a scale of 1-10 with 10 being great. Pt reports her holiday and weekend were "fine." Pt states she has felt "kind of empty" the past two days. Pt does not make eye contact,  speaks softly, and gives short answers. Pt is minimally engaged in discussion.      Session Time: 10:00 - 11:00   Participation Level:Minimal  Behavioral Response:CasualAlertDepressed  Type of Therapy:GroupTherapy  Treatment Goals addressed: Coping  Interventions:CBT, DBT, Supportive and Reframing  Summary: Cln led discussion on guilt and the way it impacts [place]. Cln utilized CBT cignitive distortion: emotional reasoning to inform discussion. Cln encouraged pt's to consider whether the guilt was founded as a first step to address the feeling. Group members discussed feelings of guilt and worked to determine whether those feelings were founded in truth or feeling.    Therapist Response: Pt minimally engaged in discussion and offers feedback to group members.       Session Time: 11:00- 12:00  Participation Level:Active  Behavioral Response:CasualAlertDepressed  Type of Therapy: Group Therapy, OT  Treatment Goals addressed: Coping  Interventions:Psychosocial skills training, Supportive  Summary:Occupational Therapy group  Therapist Response:Patient engaged in group. See OT note.      Session Time: 12:00 -1:00  Participation Level:Minimal  Behavioral Response:CasualAlertDepressed  Type of Therapy:Group therapy  Treatment Goals addressed: Coping  Interventions:CBT; Solution focused; Supportive; Reframing  Summary:12:00 - 12:50: Cln introduced topic of boundaries. Cln discussed how boundaries inform our relationships and affect self-esteem and personal agency. Group discussed the three types of boundaries: rigid, porous, and healthy and when each type is most helpful/harmful.  12:50 -1:00 Clinician led check-out. Clinician assessed for immediate needs, medication compliance and efficacy, and safety concerns  Therapist Response:12:00 - 12:50: Pt minimally engaged in discussion and reports having mostly porous boundaries.   12:50 - 1:00: At check-out, patientrates hermood at a3 on a scale of 1-10 with 10 being  great.Pt reports afternoon plans ofgoing to work. Pt demonstrates some progress as evidenced by reporting a good holiday. Patient denies SI/HI at the end of group.   Suicidal/Homicidal: Nowithout intent/plan   Plan: Pt will continue in PHP while working to decrease depression and anxiety symptoms, increase ability to mange symptoms in a healthy manner, and increase emotional regulation.   Diagnosis: Severe episode of recurrent major depressive disorder, without psychotic features (HCC) [F33.2]    1. Severe episode of recurrent major depressive disorder, without psychotic features (HCC)   2. Borderline personality disorder (HCC)       [name], LCSW 06/03/2020

## 2020-06-03 NOTE — Psych (Signed)
Virtual Visit via Video Note  I connected with Nicole Snyder on 05/11/20 at  9:00 AM EST by a video enabled telemedicine application and verified that I am speaking with the correct person using two identifiers.  Location: Patient: patient home Provider: clinical home office   I discussed the limitations of evaluation and management by telemedicine and the availability of in person appointments. The patient expressed understanding and agreed to proceed.  I discussed the assessment and treatment plan with the patient. The patient was provided an opportunity to ask questions and all were answered. The patient agreed with the plan and demonstrated an understanding of the instructions.   The patient was advised to call back or seek an in-person evaluation if the symptoms worsen or if the condition fails to improve as anticipated.  Pt was provided 240 minutes of non-face-to-face time during this encounter.   [name], LCSW    [facility] PHP THERAPIST PROGRESS NOTE  Nicole Snyder [id number]  Session Time: 9:00 - 10:00  Participation Level: Active  Behavioral Response: CasualAlertDepressed  Type of Therapy: Group Therapy  Treatment Goals addressed: Coping  Interventions: CBT, DBT, Supportive and Reframing  Summary: Clinician led check-in regarding current stressors and situation, and review of patient completed daily inventory. Clinician utilized active listening and empathetic response and validated patient emotions. Clinician facilitated processing group on pertinent issues.   Therapist Response: Nicole Snyder is a 20 y.o. female who presents with depression and anxiety symptoms. Patient arrived within time allowed and reports that she is feeling "in the middle." Patient rates hermood at a5 on a scale of 1-10 with 10 being great. Pt reports she did not come to group yesterday due to being "really tired." Pt states mood was "okay." Pt exhibits flat affect and  guarded behaviors. Pt engaged in discussion.      Session Time: 10:00 - 11:00   Participation Level:Active  Behavioral Response:CasualAlertDepressed  Type of Therapy:GroupTherapy  Treatment Goals addressed: Coping  Interventions:CBT, DBT, Supportive and Reframing  Summary: Cln led discussion on the upcoming holiday and concerns group members are experiencing. Group members discussed how they are celebrating or not and the ways in which this year will differ from previous years as well as worries they have. Cln provided space for pt's to process.   Therapist Response: Pt engaged in discussion and reports concern re: family dynamics. Pt is able to process.       Session Time: 11:00- 12:00  Participation Level:Active  Behavioral Response:CasualAlertDepressed  Type of Therapy: Group Therapy, OT  Treatment Goals addressed: Coping  Interventions:Psychosocial skills training, Supportive  Summary:Occupational Therapy group  Therapist Response:Patient engaged in group. See OT note.      Session Time: 12:00 -1:00  Participation Level:Active  Behavioral Response:CasualAlertDepressed  Type of Therapy:Group therapy  Treatment Goals addressed: Coping  Interventions:CBT; Solution focused; Supportive; Reframing  Summary:12:00 - 12:50: Cln led discussion on ways to manage through the holiday season. Group members worked to brainstorm ways to manage worries that may occur. Cln brought in boundaries, distress tolerance skills, planning ahead, and safety planning to aid discussion. Cln provided crisis information and reviewed resources. 12:50 -1:00 Clinician led check-out. Clinician assessed for immediate needs, medication compliance and efficacy, and safety concerns  Therapist Response:12:00 - 12:50: Pt engaged in discussion and is able to develop a plan of action should what she is worried about comes to pass.  12:50 - 1:00: At  check-out, patientrates hermood at a6 on a scale of 1-10  with 10 being great.Pt reports afternoon plans ofseeing family and running errands. Pt demonstrates some progress as evidenced by attending group. Patient denies SI/HI at the end of group.   Suicidal/Homicidal: Nowithout intent/plan   Plan: Pt will continue in PHP while working to decrease depression and anxiety symptoms, increase ability to mange symptoms in a healthy manner, and increase emotional regulation.   Diagnosis: Severe episode of recurrent major depressive disorder, without psychotic features (HCC) [F33.2]    1. Severe episode of recurrent major depressive disorder, without psychotic features (HCC)   2. Borderline personality disorder (HCC)       [name], LCSW 06/03/2020

## 2020-06-03 NOTE — Psych (Signed)
Virtual Visit via Video Note  I connected with Nicole Snyder on 05/17/20 at  9:00 AM EST by a video enabled telemedicine application and verified that I am speaking with the correct person using two identifiers.  Location: Patient: patient home Provider: clinical home office   I discussed the limitations of evaluation and management by telemedicine and the availability of in person appointments. The patient expressed understanding and agreed to proceed.  I discussed the assessment and treatment plan with the patient. The patient was provided an opportunity to ask questions and all were answered. The patient agreed with the plan and demonstrated an understanding of the instructions.   The patient was advised to call back or seek an in-person evaluation if the symptoms worsen or if the condition fails to improve as anticipated.  Pt was provided 240 minutes of non-face-to-face time during this encounter.   Nicole Guiles, LCSW    [facility] PHP THERAPIST PROGRESS NOTE  Nicole BEBEE [id number]  Session Time: 9:00 - 10:00  Participation Level: Minimal  Behavioral Response: CasualAlertDepressed  Type of Therapy: Group Therapy  Treatment Goals addressed: Coping  Interventions: CBT, DBT, Supportive and Reframing  Summary: Clinician led check-in regarding current stressors and situation, and review of patient completed daily inventory. Clinician utilized active listening and empathetic response and validated patient emotions. Clinician facilitated processing group on pertinent issues.   Therapist Response: Nicole Snyder is a 20 y.o. female who presents with depression and anxiety symptoms. Patient arrived within time allowed and reports that she is feeling "not good or bad." Patient rates hermood at a5 on a scale of 1-10 with 10 being great. Pt states yesterday was "tiring" at work and she didn't sleep well. 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 Pt identifies having fleeting passive SI yesterday and states she was able to ignore it. Pt continues to not make eye contact, speak softly, and give short answers. Pt is minimally engaged in discussion.      Session Time: 10:00 - 11:00   Participation Level:Minimal  Behavioral Response:CasualAlertDepressed  Type of Therapy:GroupTherapy  Treatment Goals addressed: Coping  Interventions:CBT, DBT, Supportive and Reframing  Summary: Cln led discussion on planning free time. Group members shared struggles they experience with down time with no plans and how it often leads to rumination and depressed feelings. Group brainstormed ways to fill time and ways to problem solve when feeling stuck re: free time.    Therapist Response: Pt engaged in discussion and is able to determine options to fill their time.       Session Time: 11:00- 12:00  Participation Level:Active  Behavioral Response:CasualAlertDepressed  Type of Therapy:Group therapy  Treatment Goals addressed: Coping  Interventions:Supportive, Reframing  Summary:Spiritual Care group  Therapist Response:Pt engaged in session. See chaplain note      Session Time: 12:00 -1:00  Participation Level:Active  Behavioral Response:CasualAlertDepressed  Type of Therapy:Group therapy  Treatment Goals addressed: Coping  Interventions:CBT; Solution focused; Supportive; Reframing  Summary:12:00 - 12:50: Cln continued topic of boundaries. Cln discussed the different ways boundaries present: physical, emotional, intellectual, sexual, material, and time. Group talked about the ways in which each type presents for them and is a struggle.  12:50 -1:00 Clinician led check-out. Clinician assessed for immediate needs, medication compliance and efficacy, and safety concerns  Therapist Response:12:00 - 12:50: Pt engaged in discussion and reports most issue with emotional  boundaries.  12:50 - 1:00: At check-out, patientrates hermood at  a3 on a scale of 1-10 with 10 being great.Pt reports afternoon plans ofgoing to work. Pt demonstrates some progress as evidenced by reporting a good holiday. Patient denies SI/HI at the end of group.   Suicidal/Homicidal: Nowithout intent/plan   Plan: Pt will discharge from PHP due to meeting treatment goals of decreased depression and anxiety symptoms, increased ability to mange symptoms in a healthy manner, and increased emotional regulation. Pt has declined IOP and will return to her outpatient providers. Pt has follow up appointment with her therapist Nicole Snyder on 05/24/2020. Pt and provider are aligned with discharge. Pt denies SI/HI/self harm thoughts at time of discharge.   Diagnosis: Current severe episode of major depressive disorder without psychotic features, unspecified whether recurrent (HCC) [F32.2]    1. Current severe episode of major depressive disorder without psychotic features, unspecified whether recurrent (HCC)   2. Borderline personality disorder (HCC)       Nicole Guiles, LCSW 06/03/2020

## 2020-06-03 NOTE — Psych (Signed)
Virtual Visit via Video Note  I connected with [name] on 05/05/20 at  9:00 AM EST by a video enabled telemedicine application and verified that I am speaking with the correct person using two identifiers.  Location: Patient: patient home Provider: clinical home office   I discussed the limitations of evaluation and management by telemedicine and the availability of in person appointments. The patient expressed understanding and agreed to proceed.  I discussed the assessment and treatment plan with the patient. The patient was provided an opportunity to ask questions and all were answered. The patient agreed with the plan and demonstrated an understanding of the instructions.   The patient was advised to call back or seek an in-person evaluation if the symptoms worsen or if the condition fails to improve as anticipated.  Pt was provided 60 minutes of non-face-to-face time during this encounter.   [name], LCSW    [facility] PHP THERAPIST PROGRESS NOTE  CHASITEE ZENKER [id number]  Session Time: 9:00 - 10:00  Participation Level: Active  Behavioral Response: CasualAlertAnxious and Depressed  Type of Therapy: Group Therapy  Treatment Goals addressed: Coping  Interventions: CBT, DBT, Supportive and Reframing  Summary: Clinician led check-in regarding current stressors and situation, and review of patient completed daily inventory. Clinician utilized active listening and empathetic response and validated patient emotions. Clinician facilitated processing group on pertinent issues.   Therapist Response: NYEEMA WANT is a 20 y.o. female who presents with depression and anxiety symptoms. Patient arrived within time allowed and reports that she is feeling "not great." Patient rates hermood at a3 on a scale of 1-10 with 10 being great. Pt reports she had a bad day yesterday and is struggling to move past it. Pt states she continues to be in pain from her wisdom  teeth extraction and that is likely affecting her mood as well. Pt states she experienced SI "very briefly" yesterday and was able to manage the thoughts. Pt continues to present with flat affect and is guarded in behavior. Pt engaged in discussion.   * Pt chose to leave group at 10:00 stating she was called in to work. Pt denies SI/HI before leaving group session.     Suicidal/Homicidal: Nowithout intent/plan   Plan: Pt will continue in PHP while working to decrease depression and anxiety symptoms, increase ability to mange symptoms in a healthy manner, and increase emotional regulation.   Diagnosis: Severe episode of recurrent major depressive disorder, without psychotic features (HCC) [F33.2]    1. Severe episode of recurrent major depressive disorder, without psychotic features (HCC)       [name], LCSW 06/03/2020

## 2021-08-14 ENCOUNTER — Other Ambulatory Visit: Payer: Self-pay

## 2021-08-14 ENCOUNTER — Encounter: Payer: Self-pay | Admitting: Podiatry

## 2021-08-14 ENCOUNTER — Ambulatory Visit: Payer: BC Managed Care – PPO | Admitting: Podiatry

## 2021-08-14 ENCOUNTER — Ambulatory Visit (INDEPENDENT_AMBULATORY_CARE_PROVIDER_SITE_OTHER): Payer: BC Managed Care – PPO

## 2021-08-14 DIAGNOSIS — L700 Acne vulgaris: Secondary | ICD-10-CM | POA: Insufficient documentation

## 2021-08-14 DIAGNOSIS — M778 Other enthesopathies, not elsewhere classified: Secondary | ICD-10-CM | POA: Diagnosis not present

## 2021-08-14 DIAGNOSIS — M2012 Hallux valgus (acquired), left foot: Secondary | ICD-10-CM

## 2021-08-14 MED ORDER — TRIAMCINOLONE ACETONIDE 40 MG/ML IJ SUSP
20.0000 mg | Freq: Once | INTRAMUSCULAR | Status: AC
  Administered 2021-08-14: 20 mg

## 2021-08-14 NOTE — Progress Notes (Signed)
?Subjective:  ?Patient ID: Nicole Snyder, female    DOB: 09/09/2000,  MRN: [id number] ?HPI ?Chief Complaint  ?Patient presents with  ? Foot Pain  ?  1st MPJ left - bunion deformity x years, notices now a dull ache and sometimes throbbing at work, uses toe spacer at night, tried insoles-no help  ? New Patient (Initial Visit)  ? ? ?21 y.o. female presents with the above complaint.  ? ?ROS: Denies fever chills nausea vomiting muscle aches pains calf pain back pain chest pain shortness of breath. ? ?Past Medical History:  ?Diagnosis Date  ? Asthma   ? Asthma, mild persistent 02/04/2011  ? Atopic dermatitis 02/04/2011  ? Constipation   ? Cough   ? Rhinitis   ? ?No past surgical history on file. ? ?Current Outpatient Medications:  ?  albuterol (VENTOLIN HFA) 108 (90 Base) MCG/ACT inhaler, Inhale into the lungs., Disp: , Rfl:  ?  levonorgestrel-ethinyl estradiol (ALESSE) 0.1-20 MG-MCG tablet, Take 1 tablet by mouth daily., Disp: , Rfl:  ?  cetirizine (ZYRTEC) 10 MG tablet, Take 10 mg by mouth daily., Disp: , Rfl:  ?  cyclobenzaprine (FLEXERIL) 5 MG tablet, Take 5 mg by mouth 3 (three) times daily as needed., Disp: , Rfl:  ?  EPIPEN 2-PAK 0.3 MG/0.3ML DEVI, Inject 0.3 mg into the muscle as needed (for allergic reaction). , Disp: , Rfl:  ?  fluticasone (FLONASE) 50 MCG/ACT nasal spray, USE 2 SPRAYS IN EACH NOSTRIL ONCE A DAY, Disp: 16 g, Rfl: 1 ?  gabapentin (NEURONTIN) 300 MG capsule, TAKE 3 CAPSULES TWICE A DAY, Disp: 540 capsule, Rfl: 0 ?  hydrOXYzine (ATARAX/VISTARIL) 10 MG tablet, TAKE 1 TO 3 TABLETS 3 TIMES A DAY (Patient not taking: No sig reported), Disp: 90 tablet, Rfl: 0 ?  LORazepam (ATIVAN) 0.5 MG tablet, Take 0.5 mg by mouth 2 (two) times daily as needed., Disp: , Rfl:  ?  montelukast (SINGULAIR) 10 MG tablet, TAKE 1 TABLET AT BEDTIME (Patient not taking: No sig reported), Disp: 30 tablet, Rfl: 0 ?  mupirocin ointment (BACTROBAN) 2 %, Apply 1 application topically 2 (two) times daily. (Patient not taking: No  sig reported), Disp: 22 g, Rfl: 0 ?  sertraline (ZOLOFT) 100 MG tablet, Take 150 mg by mouth daily., Disp: , Rfl:  ?  traZODone (DESYREL) 50 MG tablet, Take 50 mg by mouth at bedtime., Disp: , Rfl:  ?  triamcinolone cream (KENALOG) 0.5 %, APPLY TO AFFECTED AREA TWICE A DAY, Disp: 60 g, Rfl: 1 ? ?Allergies  ?Allergen Reactions  ? Mold Extract [Trichophyton Mentagrophyte]   ? Pollen Extract-Tree Extract   ? ?Review of Systems ?Objective:  ?There were no vitals filed for this visit. ? ?General: Well developed, nourished, in no acute distress, alert and oriented x3  ? ?Dermatological: Skin is warm, dry and supple bilateral. Nails x 10 are well maintained; remaining integument appears unremarkable at this time. There are no open sores, no preulcerative lesions, no rash or signs of infection present. ? ?Vascular: Dorsalis Pedis artery and Posterior Tibial artery pedal pulses are 2/4 bilateral with immedate capillary fill time. Pedal hair growth present. No varicosities and no lower extremity edema present bilateral.  ? ?Neruologic: Grossly intact via light touch bilateral. Vibratory intact via tuning fork bilateral. Protective threshold with Semmes Wienstein monofilament intact to all pedal sites bilateral. Patellar and Achilles deep tendon reflexes 2+ bilateral. No Babinski or clonus noted bilateral.  ? ?Musculoskeletal: No gross boney pedal deformities bilateral. No pain, crepitus,  or limitation noted with foot and ankle range of motion bilateral. Muscular strength 5/5 in all groups tested bilateral.  Painful hallux abductovalgus deformity with pain on attempted range of motion of the first metatarsophalangeal joint.  Mild hallux limitus is identified minimal tenderness on palpation of the hypertrophic medial condyle. ? ?Gait: Unassisted, Nonantalgic.  ? ? ?Radiographs: ? ?Radiographs taken today demonstrate mild to moderate hallux abductovalgus deformity with lateral deviation of the lesser digits.  Mild pes  planus. ? ?Assessment & Plan:  ? ?Assessment: Capsulitis hallux limitus hallux abductovalgus deformity left foot. ? ?Plan: Discussed etiology pathology conservative versus surgical therapies she has orthotics at home which she is on to bring to me next visit.  We discussed surgical intervention which she does not want to do at this point in time she would like to consider an injection.  At this point after sterile Betadine skin prep I injected the first metatarsophalangeal joint with 3 mg of Kenalog 5 mg Marcaine she tolerated procedure well.  Minor discomfort. ? ?She works at [employer]. ? ? ? ? ?Nicole Snyder T. Peter, DPM ?

## 2021-09-27 ENCOUNTER — Ambulatory Visit: Payer: BC Managed Care – PPO | Admitting: Podiatry

## 2021-09-27 DIAGNOSIS — M778 Other enthesopathies, not elsewhere classified: Secondary | ICD-10-CM

## 2021-09-27 MED ORDER — DEXAMETHASONE SODIUM PHOSPHATE 120 MG/30ML IJ SOLN
2.0000 mg | Freq: Once | INTRAMUSCULAR | Status: AC
  Administered 2021-09-27: 2 mg via INTRA_ARTICULAR

## 2021-09-30 NOTE — Progress Notes (Signed)
She presents today states that she seems to be doing better after her injection last time as we had diagnosed her with capsulitis with mild hallux abductovalgus deformity of her left foot. ? ?Objective: Vital signs are stable alert and oriented x3 hallux valgus deformity.  She still has some tenderness on palpation but not so much of the joint internally itself but more in the medial and plantar medial aspect of the joint. ? ?Assessment: Capsulitis hallux valgus right. ? ?Plan: I injected a small amount of dexamethasone first metatarsophalangeal joint.  I will follow-up with her in 6 weeks.  She had improved by about 60% from last visit. ? ?She works at [employer]. ?

## 2021-11-15 ENCOUNTER — Ambulatory Visit: Payer: BC Managed Care – PPO | Admitting: Podiatry

## 2021-11-15 ENCOUNTER — Encounter: Payer: Self-pay | Admitting: Podiatry

## 2021-11-15 DIAGNOSIS — M778 Other enthesopathies, not elsewhere classified: Secondary | ICD-10-CM | POA: Diagnosis not present

## 2021-11-15 DIAGNOSIS — T148XXA Other injury of unspecified body region, initial encounter: Secondary | ICD-10-CM | POA: Diagnosis not present

## 2021-11-15 NOTE — Progress Notes (Signed)
She presents today for follow-up of her capsulitis second metatarsophalangeal joint of her left foot.  She states that the injection helped for a while but she has been on her foot a lot so is really starting to bother her more.  She states that seems to be good for a while and then worse.  Objective: Vital signs are stable she is alert and oriented x3 she still has edema and mild erythema surrounding the second metatarsal phalangeal joint with pain on end range of motion of the toe.  She has swelling on the plantar aspect of the metatarsal phalangeal joint consistent with chronic inflammation she has some medial deviation of the toe consistent with possible plantar plate tear.  Assessment: Possible plantar plate tear and early dislocation of the toe second metatarsal phalangeal joint left foot.  Plan: Requesting MRI at this point due to the failure of conservative therapies to render her asymptomatic.  We are going to use this for surgical planning.

## 2021-11-29 ENCOUNTER — Ambulatory Visit
Admission: RE | Admit: 2021-11-29 | Discharge: 2021-11-29 | Disposition: A | Discharge status: Home / Self Care | Payer: BC Managed Care – PPO | Source: Ambulatory Visit | Attending: Podiatry | Admitting: Podiatry

## 2021-11-29 DIAGNOSIS — T148XXA Other injury of unspecified body region, initial encounter: Secondary | ICD-10-CM

## 2021-11-29 DIAGNOSIS — M778 Other enthesopathies, not elsewhere classified: Secondary | ICD-10-CM

## 2021-12-10 ENCOUNTER — Telehealth: Payer: Self-pay | Admitting: *Deleted

## 2021-12-10 NOTE — Telephone Encounter (Signed)
-----   Message from Elinor Parkinson, [place] sent at 12/04/2021  8:06 AM EDT ----- Please send for an over read and inform the patient of the delay and the current negative findings.

## 2021-12-10 NOTE — Telephone Encounter (Signed)
Faxed request to [facility] to send patient's MRI disc to [facility].  Faxed order to [facility] for what to review for 2nd opinion.   

## 2021-12-27 ENCOUNTER — Ambulatory Visit: Payer: BC Managed Care – PPO | Admitting: Podiatry

## 2021-12-27 DIAGNOSIS — M778 Other enthesopathies, not elsewhere classified: Secondary | ICD-10-CM

## 2021-12-27 MED ORDER — TRIAMCINOLONE ACETONIDE 40 MG/ML IJ SUSP
20.0000 mg | Freq: Once | INTRAMUSCULAR | Status: AC
  Administered 2021-12-27: 20 mg

## 2021-12-27 NOTE — Progress Notes (Signed)
She presents today for follow-up of her first metatarsophalangeal joint.  Her MRI has grown out for and over read she still having pain around the first metatarsal phalangeal joint.  She like to consider an injection if possible.  Objective: Pain on palpation of the first metatarsophalangeal joint hallux valgus deformity.  She has pain on palpation of the medial and dorsal and plantar nerves around the first metatarsal phalangeal joint.  Assessment: Neuritis hallux valgus deformity.  Plan: Injected 2 mg dexamethasone local anesthetic to the dorsal and plantar medial dorsal cutaneous nerve in the medial plantar cutaneous nerve.  Follow-up with her once the over read comes in.

## 2021-12-31 NOTE — Telephone Encounter (Signed)
I know you just saw her. Do you want to review report and let me know what you want her to do?

## 2021-12-31 NOTE — Telephone Encounter (Signed)
[facility] report is in , will place a copy in Dr Geryl Rankins folder.

## 2022-01-03 ENCOUNTER — Ambulatory Visit: Payer: BC Managed Care – PPO | Admitting: Podiatry

## 2022-01-03 DIAGNOSIS — M2012 Hallux valgus (acquired), left foot: Secondary | ICD-10-CM

## 2022-01-03 NOTE — Progress Notes (Signed)
She presents today with her mother for follow-up of her MRI so her mother could listen and help her decide if there is anything surgical that needs to be done.  She states that the injections helped some degree last time but only for a few days.  Objective: Vital signs are stable she is alert and oriented x3 the majority of her pain is centralized around the first metatarsal phalangeal joint but she is very sensitive on range of motion to all of the lesser metatarsals as well.  MRI did not demonstrate any type pathology to the forefoot however the fifth metatarsal base did demonstrate a mild ganglion or bursa.  Assessment: Painful hallux limitus mild hallux valgus.  Plan: Discussed etiology pathology and surgical therapies I think that a McBride and a cheilectomy would probably be the best for her first metatarsal head.  We did discuss the possible osteotomies in the [facility] however she does not feel that she has the time to heal that.  I did explain to her that most likely in the future she would need to have another procedure performed she understands and is amenable to it.  We did discuss the possible postop complications which may include but not limited to postop pain bleeding swell infection recurrence need for further surgery overcorrection under correction loss of digit loss of limb loss of life she also understands that she will need to be out of work with the foot elevated for.  As well as school.

## 2022-01-04 ENCOUNTER — Telehealth: Payer: Self-pay | Admitting: Podiatry

## 2022-01-04 NOTE — Telephone Encounter (Signed)
Left v/m letting pt know I had her surgery quote ready and that the quote is just for Dr. Milinda Pointer. I told her if she wants the quotes for [facility] and the Anesthesiologist, she needs to contact [facility]. I told the pt her rough estimate for surgery is [phone] and that she has a 30% coinsurance and that her deductible is $3,000 with only $834.73 met and $2,165.27 remaining. I told her the surgery may be applied to her deductible and it would depend which bill that falls on as it depends which bill her insurance company receives first, wether it be our bill, the surgical centers bill, or the anesthesiologist bill. Told pt to call us back with any questions and/or concerns.

## 2022-01-18 ENCOUNTER — Telehealth: Payer: Self-pay | Admitting: Urology

## 2022-01-18 HISTORY — PX: FOOT SURGERY: SHX648

## 2022-01-18 NOTE — Telephone Encounter (Signed)
DOS - 02/15/22  MCBRIDE BUNIONECTOMY LEFT --- 16109 CHEILECTOMY LEFT --- [id number]  [employer] EFFECTIVE DATE - 05/20/21   PLAN DEDUCTIBLE - $3,000.00 W/ $0,981.19 REMAINING OUT OF POCKET - $8,700.00 W/ $6,540.16 REMAINING COINSURANCE - 30% COPAY - $0.00   NO PRIOR AUTH IS REQUIRED.

## 2022-02-07 ENCOUNTER — Ambulatory Visit: Payer: BC Managed Care – PPO | Admitting: Podiatry

## 2022-02-07 DIAGNOSIS — M2012 Hallux valgus (acquired), left foot: Secondary | ICD-10-CM

## 2022-02-07 NOTE — Progress Notes (Signed)
She presents today for discussion prior to surgery which is scheduled next week.  This is regarding her first metatarsal phalangeal joint.  Objective: Vital signs stable she alert oriented x3 she has hallux limitus first metatarsophalangeal with mild bunion deformities.  Assessment hallux valgus with hallux limitus.  Plan: Answered multitude of questions which we had previously gone over at the time of consult.  Currently she understands everything including anesthesia postop pain control, she is going have to be off of her feet prior to going back to work.  I will follow-up with her in 1 week for surgical intervention.

## 2022-02-13 ENCOUNTER — Other Ambulatory Visit: Payer: Self-pay | Admitting: Podiatry

## 2022-02-13 MED ORDER — CEPHALEXIN 500 MG PO CAPS: 500.0000 mg | ORAL_CAPSULE | Freq: Three times a day (TID) | ORAL | 0 refills | Status: AC

## 2022-02-13 MED ORDER — HYDROCODONE-ACETAMINOPHEN 10-325 MG PO TABS: 1.0000 | ORAL_TABLET | Freq: Four times a day (QID) | ORAL | 0 refills | Status: AC | PRN

## 2022-02-13 MED ORDER — ONDANSETRON HCL 4 MG PO TABS: 4.0000 mg | ORAL_TABLET | Freq: Three times a day (TID) | ORAL | 0 refills | Status: AC | PRN

## 2022-02-15 DIAGNOSIS — M2012 Hallux valgus (acquired), left foot: Secondary | ICD-10-CM | POA: Diagnosis not present

## 2022-02-19 ENCOUNTER — Telehealth: Payer: Self-pay | Admitting: *Deleted

## 2022-02-19 NOTE — Telephone Encounter (Signed)
Patient's mother is calling because patient is having tingling still from previous surgery a few days ago, is this normal? Spoke with mother going over the aftercare instructions and that the tingling should resolve with elevation,icing,loosening the ache wrapping on foot.  She verbalized understanding.

## 2022-02-21 ENCOUNTER — Ambulatory Visit (INDEPENDENT_AMBULATORY_CARE_PROVIDER_SITE_OTHER): Payer: BC Managed Care – PPO

## 2022-02-21 ENCOUNTER — Ambulatory Visit (INDEPENDENT_AMBULATORY_CARE_PROVIDER_SITE_OTHER): Payer: BC Managed Care – PPO | Admitting: Podiatry

## 2022-02-21 DIAGNOSIS — M2012 Hallux valgus (acquired), left foot: Secondary | ICD-10-CM | POA: Diagnosis not present

## 2022-02-21 NOTE — Progress Notes (Signed)
Subjective:  Patient ID: Nicole Snyder, female    DOB: 2000/08/01,  MRN: [id number] HPI Chief Complaint  Patient presents with   Routine Post Op    POV #1 DOS 02/15/2022 MCBRIDE BUNION REPAIR LT FOOT, CONDYLECTOMY DORAL 1ST METATARSAL LT FOOT    21 y.o. female presents with the above complaint.   ROS: Denies fever chills nausea vomiting muscle aches and pains.  Past Medical History:  Diagnosis Date   Asthma    Asthma, mild persistent 02/04/2011   Atopic dermatitis 02/04/2011   Constipation    Cough    Rhinitis    No past surgical history on file.  Current Outpatient Medications:    albuterol (VENTOLIN HFA) 108 (90 Base) MCG/ACT inhaler, Inhale into the lungs., Disp: , Rfl:    cephALEXin (KEFLEX) 500 MG capsule, Take 1 capsule (500 mg total) by mouth 3 (three) times daily., Disp: 30 capsule, Rfl: 0   cetirizine (ZYRTEC) 10 MG tablet, Take 10 mg by mouth daily., Disp: , Rfl:    cyclobenzaprine (FLEXERIL) 5 MG tablet, Take 5 mg by mouth 3 (three) times daily as needed., Disp: , Rfl:    EPIPEN 2-PAK 0.3 MG/0.3ML DEVI, Inject 0.3 mg into the muscle as needed (for allergic reaction). , Disp: , Rfl:    fluticasone (FLONASE) 50 MCG/ACT nasal spray, USE 2 SPRAYS IN EACH NOSTRIL ONCE A DAY, Disp: 16 g, Rfl: 1   gabapentin (NEURONTIN) 300 MG capsule, TAKE 3 CAPSULES TWICE A DAY, Disp: 540 capsule, Rfl: 0   hydrOXYzine (ATARAX/VISTARIL) 10 MG tablet, TAKE 1 TO 3 TABLETS 3 TIMES A DAY (Patient not taking: No sig reported), Disp: 90 tablet, Rfl: 0   levonorgestrel-ethinyl estradiol (ALESSE) 0.1-20 MG-MCG tablet, Take 1 tablet by mouth daily., Disp: , Rfl:    LORazepam (ATIVAN) 0.5 MG tablet, Take 0.5 mg by mouth 2 (two) times daily as needed., Disp: , Rfl:    montelukast (SINGULAIR) 10 MG tablet, TAKE 1 TABLET AT BEDTIME (Patient not taking: No sig reported), Disp: 30 tablet, Rfl: 0   mupirocin ointment (BACTROBAN) 2 %, Apply 1 application topically 2 (two) times daily. (Patient not taking:  No sig reported), Disp: 22 g, Rfl: 0   ondansetron (ZOFRAN) 4 MG tablet, Take 1 tablet (4 mg total) by mouth every 8 (eight) hours as needed., Disp: 20 tablet, Rfl: 0   sertraline (ZOLOFT) 100 MG tablet, Take 150 mg by mouth daily., Disp: , Rfl:    traZODone (DESYREL) 50 MG tablet, Take 50 mg by mouth at bedtime., Disp: , Rfl:    triamcinolone cream (KENALOG) 0.5 %, APPLY TO AFFECTED AREA TWICE A DAY, Disp: 60 g, Rfl: 1  Allergies  Allergen Reactions   Mold Extract [Trichophyton Mentagrophyte]    Pollen Extract-Tree Extract    Review of Systems Objective:  There were no vitals filed for this visit.  General: Well developed, nourished, in no acute distress, alert and oriented x3   Dermatological: Skin is warm, dry and supple bilateral. Nails x 10 are well maintained; remaining integument appears unremarkable at this time. There are no open sores, no preulcerative lesions, no rash or signs of infection present.  Vascular: Dorsalis Pedis artery and Posterior Tibial artery pedal pulses are 2/4 bilateral with immedate capillary fill time. Pedal hair growth present. No varicosities and no lower extremity edema present bilateral.   Neruologic: Grossly intact via light touch bilateral. Vibratory intact via tuning fork bilateral. Protective threshold with Semmes Wienstein monofilament intact to all pedal sites bilateral.  Patellar and Achilles deep tendon reflexes 2+ bilateral. No Babinski or clonus noted bilateral.   Musculoskeletal: No gross boney pedal deformities bilateral. No pain, crepitus, or limitation noted with foot and ankle range of motion bilateral. Muscular strength 5/5 in all groups tested bilateral.  Dry sterile dressing intact was removed demonstrates surgical foot appears to be healing very nicely she is got good range of motion dorsiflexion and plantarflexion sutures are intact margins well coapted.  Gait: Unassisted, Nonantalgic.    Radiographs:  Radiographs taken today of the  left foot demonstrate an osseously mature foot mild swelling.  Does demonstrate a cheilectomy with peaking of the tibial sesamoid also demonstrates a Designer, fashion/clothing.  Assessment & Plan:   Assessment: Well-healing surgical foot left.  Plan: Redressed today dressed a compressive dressing encouraged range of motion exercises recommended that she do this outside of of her boot.  I will follow-up with her in 1 week     Alexandrya Chim T. [place], [place]

## 2022-02-26 ENCOUNTER — Telehealth: Payer: Self-pay | Admitting: *Deleted

## 2022-02-26 NOTE — Telephone Encounter (Signed)
Contacted patient giving physician's  instructions, verbalized understanding.

## 2022-02-26 NOTE — Telephone Encounter (Signed)
She needs to wait until next appointment to see if we can get her into a smaller shoe

## 2022-02-26 NOTE — Telephone Encounter (Signed)
Patient is calling to ask if she is able to take boot off while sleeping and is able to drive. Please advise.

## 2022-02-28 ENCOUNTER — Ambulatory Visit (INDEPENDENT_AMBULATORY_CARE_PROVIDER_SITE_OTHER): Payer: BC Managed Care – PPO | Admitting: Podiatry

## 2022-02-28 ENCOUNTER — Encounter: Payer: Self-pay | Admitting: Podiatry

## 2022-02-28 DIAGNOSIS — Z9889 Other specified postprocedural states: Secondary | ICD-10-CM

## 2022-02-28 DIAGNOSIS — M2012 Hallux valgus (acquired), left foot: Secondary | ICD-10-CM | POA: Diagnosis not present

## 2022-02-28 NOTE — Progress Notes (Signed)
She presents today for her second postop visit date of surgery 02/15/2022 McBride bunion repair left foot condylectomy dorsal aspect first metatarsal phalangeal joint left foot.  She states that seems to be doing pretty good she wants to be out of the boot and into a Darco shoe was to be able to drive and walk more.  Objective: Vital signs are stable alert oriented x3.  There is no erythema to some mild edema no cellulitis drainage odor incision site is gone on to heal uneventfully has tenderness on plantarflexion and dorsiflexion.  Assessment: Well-healing surgical foot.  Plan: Remove the ends of the suture today and allow her to start doing this we have placed her in a compression anklet and a Darco shoe following her start ambulating more and I like to follow-up with her in about 2 to 3 weeks at which time she will bring her tennis shoe with her.

## 2022-03-15 ENCOUNTER — Ambulatory Visit (INDEPENDENT_AMBULATORY_CARE_PROVIDER_SITE_OTHER): Payer: BC Managed Care – PPO

## 2022-03-15 ENCOUNTER — Ambulatory Visit (INDEPENDENT_AMBULATORY_CARE_PROVIDER_SITE_OTHER): Payer: BC Managed Care – PPO | Admitting: Podiatry

## 2022-03-15 DIAGNOSIS — Z9889 Other specified postprocedural states: Secondary | ICD-10-CM

## 2022-03-15 DIAGNOSIS — M2012 Hallux valgus (acquired), left foot: Secondary | ICD-10-CM | POA: Diagnosis not present

## 2022-03-15 NOTE — Progress Notes (Unsigned)
She presents today for her second postop visit date of surgery 02/15/2022 McBride bunion repair left foot condylectomy dorsal aspect first metatarsal phalangeal joint left foot.  She states that seems to be doing pretty good she states she has been in regular shoes no pain.  Objective: Vital signs are stable alert oriented x3.  There is no erythema to some mild edema no cellulitis drainage odor incision site is gone on to heal uneventfully has tenderness on plantarflexion and dorsiflexion.  Assessment: Well-healing surgical foot.  Plan: Clinically healing well good correction alignment noted.  Patient has returned to regular shoe she is officially discharged from our care.  She will follow back up with Dr. Milinda Pointer as needed.  She states understanding.[name]

## 2022-03-20 ENCOUNTER — Telehealth: Payer: Self-pay | Admitting: Podiatry

## 2022-03-20 NOTE — Telephone Encounter (Signed)
Lvm to inform pt that her PostOp appt time on 11/9 has been changed to 9:30 & she will see the nurse that day instead of Dr. Milinda Pointer

## 2022-03-28 ENCOUNTER — Encounter: Payer: Self-pay | Admitting: Podiatry

## 2022-03-28 ENCOUNTER — Encounter: Payer: BC Managed Care – PPO | Admitting: Podiatry

## 2022-03-28 ENCOUNTER — Ambulatory Visit (INDEPENDENT_AMBULATORY_CARE_PROVIDER_SITE_OTHER): Payer: BC Managed Care – PPO | Admitting: Podiatry

## 2022-03-28 DIAGNOSIS — M2012 Hallux valgus (acquired), left foot: Secondary | ICD-10-CM

## 2022-03-29 NOTE — Progress Notes (Signed)
Nicole Snyder presents today for her first postop visit date of surgery 02/15/2022 McBride bunion repair left foot.  She states that she is doing quite well.  She denies fever chills nausea vomit muscle aches and pains.  States that she is doing her physical therapy at home.  She would like to return to work full-time.  Objective: Vital signs are stable she is alert and oriented x3.  Much decrease in edema no erythema cellulitis drainage or odor incision site is still thick with scar tissue and she has limited range of motion particularly dorsiflexion and plantarflexion but currently it is pain-free she states.  Assessment: Well-healing surgical foot.  Plan: Follow-up with me on an as-needed basis.

## 2022-08-05 ENCOUNTER — Telehealth: Payer: Self-pay

## 2022-08-06 ENCOUNTER — Other Ambulatory Visit: Payer: Self-pay

## 2022-08-06 DIAGNOSIS — M778 Other enthesopathies, not elsewhere classified: Secondary | ICD-10-CM

## 2022-08-06 DIAGNOSIS — M2012 Hallux valgus (acquired), left foot: Secondary | ICD-10-CM

## 2022-08-06 DIAGNOSIS — Z9889 Other specified postprocedural states: Secondary | ICD-10-CM

## 2022-08-06 NOTE — Progress Notes (Signed)
amb  

## 2022-08-06 NOTE — Telephone Encounter (Signed)
Referral was faxed over and patient is aware

## 2022-12-10 ENCOUNTER — Other Ambulatory Visit (HOSPITAL_COMMUNITY): Payer: Self-pay | Admitting: Otolaryngology

## 2022-12-10 DIAGNOSIS — K219 Gastro-esophageal reflux disease without esophagitis: Secondary | ICD-10-CM

## 2022-12-10 DIAGNOSIS — R1314 Dysphagia, pharyngoesophageal phase: Secondary | ICD-10-CM

## 2022-12-24 ENCOUNTER — Ambulatory Visit (HOSPITAL_COMMUNITY)
Admission: RE | Admit: 2022-12-24 | Discharge: 2022-12-24 | Disposition: A | Discharge status: Home / Self Care | Payer: BC Managed Care – PPO | Source: Ambulatory Visit | Attending: Otolaryngology | Admitting: Otolaryngology

## 2022-12-24 DIAGNOSIS — R1314 Dysphagia, pharyngoesophageal phase: Secondary | ICD-10-CM | POA: Diagnosis present

## 2022-12-24 DIAGNOSIS — K219 Gastro-esophageal reflux disease without esophagitis: Secondary | ICD-10-CM | POA: Diagnosis present

## 2023-01-02 ENCOUNTER — Encounter (INDEPENDENT_AMBULATORY_CARE_PROVIDER_SITE_OTHER): Payer: Self-pay | Admitting: Otolaryngology

## 2023-01-02 ENCOUNTER — Ambulatory Visit (INDEPENDENT_AMBULATORY_CARE_PROVIDER_SITE_OTHER): Payer: BC Managed Care – PPO | Admitting: Otolaryngology

## 2023-01-02 VITALS — BP 100/68 | HR 69 | Ht 63.0 in | Wt 135.0 lb

## 2023-01-02 DIAGNOSIS — J342 Deviated nasal septum: Secondary | ICD-10-CM

## 2023-01-02 DIAGNOSIS — J3089 Other allergic rhinitis: Secondary | ICD-10-CM

## 2023-01-02 DIAGNOSIS — R0981 Nasal congestion: Secondary | ICD-10-CM

## 2023-01-02 DIAGNOSIS — R09A2 Foreign body sensation, throat: Secondary | ICD-10-CM | POA: Diagnosis not present

## 2023-01-02 DIAGNOSIS — R1310 Dysphagia, unspecified: Secondary | ICD-10-CM

## 2023-01-02 DIAGNOSIS — R0982 Postnasal drip: Secondary | ICD-10-CM

## 2023-01-02 DIAGNOSIS — K219 Gastro-esophageal reflux disease without esophagitis: Secondary | ICD-10-CM | POA: Diagnosis not present

## 2023-01-02 MED ORDER — CETIRIZINE HCL 10 MG PO TABS: 10.0000 mg | ORAL_TABLET | Freq: Every day | ORAL | 11 refills | Status: DC

## 2023-01-02 MED ORDER — FAMOTIDINE 20 MG PO TABS: 20.0000 mg | ORAL_TABLET | Freq: Two times a day (BID) | ORAL | 2 refills | Status: AC

## 2023-01-02 MED ORDER — FLUTICASONE PROPIONATE 50 MCG/ACT NA SUSP: 2.0000 | Freq: Every day | NASAL | 6 refills | Status: DC

## 2023-01-02 NOTE — Patient Instructions (Signed)
-   slowly transition from Omeprazole to Pepcid (new reflux medication we prescribed) - try Reflux Gourmet for reflux (available on [employer]) - continue daily Zyrtec and start daily Flonase - schedule swallow study  - we will see you back in 2 months or when you are back in [place] for results review and for repeat exam

## 2023-01-02 NOTE — Progress Notes (Signed)
ENT CONSULT:  Reason for Consult: dysphagia, globus sensation, odynophagia and throat mucus   HPI: Nicole Snyder is an 22 y.o. female with hx of  asthma and environmental/seasonal allergies, hx of GERD on PPI, here for evaluation of dysphagia, globus sensation, odynophagia and excessive mucus in her throat.  She had an episode of a pill stuck in her throat in January 2024, she then developed recurrent sx of throat discomfort and inability to swallow solid foods where food does not want to go down, and she has to spit it back up.  She has nasal congestion and lots of mucus in her throat. She feels that she cannot swallow meats, harder fruits, some crunchy foods. She spits it up. She coughs up yellow and white mucus frequently. She switched to soft foods 2/2 sx. She feels her sx worsen later in the day. She is on allergy shots x 1 mo, and on Zyrtec daily. She was previously on allergy shots, but stopped them (relocated for school). Feels it is helping. She has post-nasal drainage.  On Omeprazole 20 mg BID for heartburn, and since she started taking it, her throat sx are better. No hx of food allergies. No hx of GI evaluation/EGD in the past. No dysphonia. She reports feeling winded at times when she eats and food gets stuck and she has to cough it back up, no dyspnea.  At times, she feels like she swallowed glass when eating.  The patient was previously seen by Dr. Ernestene Kiel ENT, who documented 18 pound weight loss based on the patient history since symptom onset.  Esophagram was negative for stricture or anatomic obstruction and only showed evidence of GERD.    Records Reviewed:  Seen in ED/UC for migraine 09/2022 PCP note 11/14/22 Hx of migraines, hx of atopic dermatitis, hx of borderline personality d/o   Office note by Dr Ernestene Kiel  HISTORY OF PRESENT ILLNESS: Nicole Snyder is a 22 y.o. year old female who presents for complaint of sore throat and sensation of lump in her throat. Notably, she was  seen by ENT, Dr. Lucita Lora in [place], on 06/21/2022 and underwent an in-office laryngoscopy, which was normal. She has a history of GERD and takes omeprazole.  The patient has been experiencing severe "throat congestion" for several months, characterized by expectoration of phlegm. She also reports a sensation akin to glass or a thick blockage in her throat. Initially, she managed her GERD symptoms through dietary modifications and initiation of omeprazole, which completely resolved her symptoms for a duration of one week. However, the omeprazole and dietary modifications have proven ineffective in managing her symptoms. Despite adhering to a regimen of omeprazole and dietary modifications, she continues to experience throat discomfort, particularly when swallowing. She reports no issues with liquids, but experiences difficulty swallowing food, particularly meat, and has to frequently chew foods like yogurt and apple sauce. She has lost approximately 18 pounds since the onset of her symptoms. She denies any choking episodes. She denies any facial numbness. She denies any throat trauma or neck surgery. Her symptoms began in 05/2022 following an incorrect pill ingestion, which she believes may have resulted in a minor scratch in her throat. Her heartburn is infrequent, depending on her diet, and she has eliminated spicy or fried foods.  She drinks very little alcohol. She works at a [employer]. Moving back to [place] in 1 month.  No hx of neck surgery or neck trauma.   RECOMMENDATIONS:  Discussed that I do not appreciate  any vocal fold immobility, vocal fold lesions, or vocal fold atrophy/presbylarynges. Symptoms are suggestive of irritation from possible acid reflux/LPR. I have recommended a trial of PPI (OTC or via PCP) as well as dietary and lifestyle modifications to reduce acid production. Discussed that LPR patients often do not have classic signs of GERD (heartburn), but instead  may have hoarseness, throat clearing, dysphagia, increased phlegm, and globus sensation.  Due to dysphagia and weight loss I recommended referral to [facility] laryngologist Dr. Irene Pap and MBS/barium esophagram.    Past Medical History:  Diagnosis Date   Asthma    Asthma, mild persistent 02/04/2011   Atopic dermatitis 02/04/2011   Constipation    Cough    Rhinitis     Past Surgical History:  Procedure Laterality Date   FOOT SURGERY  01/18/2022    Family History  Problem Relation Age of Onset   Heart disease Father    Hyperlipidemia Father    Hypertension Father    Asthma Father    Depression Father    Heart disease Maternal Grandmother    Cancer Maternal Grandmother        breeast   Stroke Maternal Grandmother    Hypertension Maternal Grandmother    Depression Maternal Grandmother    Hearing loss Maternal Grandfather    Heart disease Paternal Grandmother    Hyperlipidemia Paternal Grandmother    Hypertension Paternal Grandmother    Heart disease Paternal Grandfather    Hyperlipidemia Paternal Grandfather    Hypertension Paternal Grandfather    Depression Mother    Asthma Maternal Aunt    Asthma Paternal Aunt    Alcohol abuse Neg Hx    Arthritis Neg Hx    Birth defects Neg Hx    COPD Neg Hx    Diabetes Neg Hx    Drug abuse Neg Hx    Early death Neg Hx    Kidney disease Neg Hx    Learning disabilities Neg Hx    Mental illness Neg Hx    Mental retardation Neg Hx    Miscarriages / Stillbirths Neg Hx    Vision loss Neg Hx    Varicose Veins Neg Hx     Social History:  reports that she has never smoked. She has never used smokeless tobacco. She reports that she does not drink alcohol and does not use drugs.  Allergies:  Allergies  Allergen Reactions   Mold Extract [Trichophyton Mentagrophyte]    Pollen Extract-Tree Extract     Medications: I have reviewed the patient's current medications.  The PMH, PSH, Medications, Allergies, and SH were reviewed and  updated.  ROS: Constitutional: Negative for fever, weight loss and weight gain. Cardiovascular: Negative for chest pain and dyspnea on exertion. Respiratory: Is not experiencing shortness of breath at rest. Gastrointestinal: Negative for nausea and vomiting. Neurological: Negative for headaches. Psychiatric: The patient is not nervous/anxious  Blood pressure 100/68, pulse 69, height 5\' 3"  (1.6 m), weight 135 lb (61.2 kg), last menstrual period 12/14/2022, SpO2 100%.  PHYSICAL EXAM:  Exam: General: Well-developed, well-nourished Communication and Voice: Clear pitch and clarity Respiratory Respiratory effort: Equal inspiration and expiration without stridor Cardiovascular Peripheral Vascular: Warm extremities with equal color/perfusion Eyes: No nystagmus with equal extraocular motion bilaterally Neuro/Psych/Balance: Patient oriented to person, place, and time; Appropriate mood and affect; Gait is intact with no imbalance; Cranial nerves I-XII are intact Head and Face Inspection: Normocephalic and atraumatic without mass or lesion Palpation: Facial skeleton intact without bony stepoffs Salivary Glands: No mass or  tenderness Facial Strength: Facial motility symmetric and full bilaterally ENT Pinna: External ear intact and fully developed External canal: Canal is patent with intact skin Tympanic Membrane: Clear and mobile External Nose: No scar or anatomic deformity Internal Nose: Septum is relatively straight on the left with caudal septal spur. No polyp, or purulence. Mucosal edema and erythema present.  Bilateral inferior turbinate hypertrophy.  Lips, Teeth, and gums: Mucosa and teeth intact and viable TMJ: No pain to palpation with full mobility Oral cavity/oropharynx: No erythema or exudate, no lesions present Nasopharynx: No mass or lesion with intact mucosa Hypopharynx: Intact mucosa without pooling of secretions Larynx Glottic: Full true vocal cord mobility without lesion  or mass Supraglottic: Normal appearing epiglottis and AE folds Interarytenoid Space: Moderate edema and pachydermia  Subglottic Space: Patent without lesion or edema Neck Neck and Trachea: Midline trachea without mass or lesion Thyroid: No mass or nodularity Lymphatics: No lymphadenopathy  Procedure: Preoperative diagnosis: Dysphagia odynophagia  Postoperative diagnosis:   Same  Procedure: Flexible fiberoptic laryngoscopy  Surgeon: Ashok Croon, MD  Anesthesia: Topical lidocaine and Afrin Complications: None Condition is stable throughout exam  Indications and consent:  The patient presents to the clinic with Indirect laryngoscopy view was incomplete. Thus it was recommended that they undergo a flexible fiberoptic laryngoscopy. All of the risks, benefits, and potential complications were reviewed with the patient preoperatively and verbal informed consent was obtained.  Procedure: The patient was seated upright in the clinic. Topical lidocaine and Afrin were applied to the nasal cavity. After adequate anesthesia had occurred, I then proceeded to pass the flexible telescope into the nasal cavity. The nasal cavity was patent without rhinorrhea or polyp. The nasopharynx was also patent without mass or lesion. The base of tongue was visualized and was normal. There were no signs of pooling of secretions in the piriform sinuses. The true vocal folds were mobile bilaterally. There were no signs of glottic or supraglottic mucosal lesion or mass. There was moderate interarytenoid pachydermia and post cricoid edema. The telescope was then slowly withdrawn and the patient tolerated the procedure throughout.    PROCEDURE NOTE: nasal endoscopy  Preoperative diagnosis: chronic nasal congestion symptoms  Postoperative diagnosis: same  Procedure: Diagnostic nasal endoscopy (16109)  Surgeon: Ashok Croon, M.D.  Anesthesia: Topical lidocaine and Afrin  H&P REVIEW: The patient's history  and physical were reviewed today prior to procedure. All medications were reviewed and updated as well. Complications: None Condition is stable throughout exam Indications and consent: The patient presents with symptoms of chronic sinusitis not responding to previous therapies. All the risks, benefits, and potential complications were reviewed with the patient preoperatively and informed consent was obtained. The time out was completed with confirmation of the correct procedure.   Procedure: The patient was seated upright in the clinic. Topical lidocaine and Afrin were applied to the nasal cavity. After adequate anesthesia had occurred, the rigid nasal endoscope was passed into the nasal cavity. The nasal mucosa, turbinates, septum, and sinus drainage pathways were visualized bilaterally. This revealed no purulence or significant secretions that might be cultured. There were no polyps or sites of significant inflammation. The mucosa was intact and there was no crusting present. The scope was then slowly withdrawn and the patient tolerated the procedure well. There were no complications or blood loss.   Studies Reviewed: Esophagram 12/24/22 EXAM: ESOPHAGUS/BARIUM SWALLOW/TABLET STUDY   TECHNIQUE: Single contrast examination was performed using thin liquid barium. This exam was performed by Anders Grant NP and was supervised and  interpreted by Dr. Sebastian Ache.   FLUOROSCOPY: Radiation Exposure Index (as provided by the fluoroscopic device): 7.3 mGy Kerma   COMPARISON:  None Available.   FINDINGS: This examination is limited as the patient was unable to tolerate the double contrast portion, and predominantly small boluses of swallowed barium result in suboptimal esophageal distension.   Swallowing: Appears normal. No vestibular penetration or aspiration seen.   Pharynx: Unremarkable.   Esophagus: No evidence of a significant stricture or mass within study limitations.    Esophageal motility: Grossly within normal limits.   Hiatal Hernia: None.   Gastroesophageal reflux: Small volume gastroesophageal reflux observed to the upper third of the esophagus during the water siphon maneuver.   Ingested 13 mm barium tablet: Declined by the patient.   IMPRESSION: Limited esophagram as described above. Small volume gastroesophageal reflux.   Assessment/Plan: Encounter Diagnoses  Name Primary?   Dysphagia, unspecified type Yes   Odynophagia    Gastroesophageal reflux disease without esophagitis    Nasal congestion    Deviated nasal septum    Environmental and seasonal allergies    Globus sensation    Post-nasal drip    22 year old female with history of environmental allergies previously on allergy shots and more recently resumed allergy shots for persistent nasal congestion and postnasal drainage, who is here for evaluation of dysphagia to solids, globus sensation, sensation of excessive mucus in her throat and intermittent odynophagia.  Esophagram showed GERD but no stricture or any other abnormalities.  Never had modified barium swallow.  On daily Zyrtec and resumed allergy shots 1 month ago, and feels that it is helping with nasal congestion.  Recently started PPI, but reports no changes in her throat symptoms.   On my exam today including nasal endoscopy and flexible laryngoscopy there was evidence of nasal septal deviation inferior turban hypertrophy and mucosal edema with nonpurulent postnasal drainage and no polyps, she had no masses or lesions to suggest pathology in oropharynx or around the larynx hypopharynx.  There was evidence of moderate postcricoid edema and pachydermia consistent with GERD LPR.  There was no pooling of secretions in piriforms.   I discussed exam findings with the patient and suggested modified barium swallow to complete evaluation of her dysphagia symptoms.  Some of her symptoms resemble symptoms of CP dysfunction in the setting  of chronic GERD LPR, although sensation of swallowing glass is not typically present in patients with CP hypertrophy from chronic GERD.  Other possibilities include EoE, will consider in the future and will refer to GI for EGD and biopsy.  I instructed the patient to transition to Pepcid and slowly wean off omeprazole.  She will try diet and lifestyle changes to minimize reflux.  She was also instructed to try alginates.  She will continue daily Zyrtec and start daily Flonase.  She will return after testing.  Of note she is going to move to [place] for school in approximately 1 month, and will likely return after swallow study later this fall.   - slowly transition from Omeprazole to Pepcid (new reflux medication we prescribed) - try Reflux [employer] for reflux (available on [employer]) - continue daily Zyrtec and start daily Flonase - schedule swallow study  - we will see you back in 2 months or when you are back in [place] for results review and for repeat exam   Thank you for allowing me to participate in the care of this patient. Please do not hesitate to contact me with any questions or concerns.  Ashok Croon, MD Otolaryngology [facility] Phone: [phone] Fax: [phone]    01/02/2023, 5:59 PM

## 2023-03-03 ENCOUNTER — Encounter (INDEPENDENT_AMBULATORY_CARE_PROVIDER_SITE_OTHER): Payer: Self-pay | Admitting: Otolaryngology

## 2023-03-03 ENCOUNTER — Ambulatory Visit (INDEPENDENT_AMBULATORY_CARE_PROVIDER_SITE_OTHER): Payer: BC Managed Care – PPO | Admitting: Otolaryngology

## 2023-03-03 VITALS — BP 105/69 | HR 69

## 2023-03-03 DIAGNOSIS — R09A2 Foreign body sensation, throat: Secondary | ICD-10-CM

## 2023-03-03 DIAGNOSIS — R1310 Dysphagia, unspecified: Secondary | ICD-10-CM | POA: Diagnosis not present

## 2023-03-03 DIAGNOSIS — R053 Chronic cough: Secondary | ICD-10-CM

## 2023-03-03 DIAGNOSIS — R0982 Postnasal drip: Secondary | ICD-10-CM

## 2023-03-03 DIAGNOSIS — R042 Hemoptysis: Secondary | ICD-10-CM

## 2023-03-03 DIAGNOSIS — R0981 Nasal congestion: Secondary | ICD-10-CM | POA: Diagnosis not present

## 2023-03-03 DIAGNOSIS — K219 Gastro-esophageal reflux disease without esophagitis: Secondary | ICD-10-CM

## 2023-03-03 DIAGNOSIS — J342 Deviated nasal septum: Secondary | ICD-10-CM

## 2023-03-03 DIAGNOSIS — J3089 Other allergic rhinitis: Secondary | ICD-10-CM

## 2023-03-03 MED ORDER — FLUTICASONE PROPIONATE 50 MCG/ACT NA SUSP: 2.0000 | Freq: Every day | NASAL | 6 refills | Status: AC

## 2023-03-03 MED ORDER — CETIRIZINE HCL 10 MG PO TABS: 10.0000 mg | ORAL_TABLET | Freq: Every day | ORAL | 11 refills | Status: AC

## 2023-03-03 NOTE — Progress Notes (Signed)
ENT Progress Note:  Update 03/03/23: She returns for 41-month follow-up.  Records review indicate that she was seen in the ER twice since last office visit 2 months ago.  She was seen 01/11/2023 for persistent sore throat and left-sided chest pain after taking cold medication, she was worried that she aspirated something.  Based on ED evaluation at the time she was thought to have esophageal spasm and was instructed to see her primary care physician.  She then returned to the ED 4 days later with a report of coughing up mucus with bright red blood in the mucus, and after unremarkable evaluation in the ED she again was instructed to see her family practitioner.  Since the last ED visit on 01/07/2023 she saw family medicine twice on 01/23/2023 and 01/26/2023, and was sent to see GI.  Unfortunately family medicine notes are not available for review, but based on GI encounter few days ago she was advised a trial of fiber Miralax and hydration, and will be scheduled for upper endoscopy in the near future.  She was also instructed to continue omeprazole for known history of GERD LPR.  Our plan last time included scheduling modified barium swallow, due to reported symptoms of dysphagia, but unfortunately records indicate that she did not complete modified barium swallow at this time.  She tried Reflux Gourmet, and not sure if it helped. She is on PPI still. She gets sharp stabbing pains around her epigastric area and around her flanks. She has chronic cough and coughs up blood tinged mucus from time to time. Drinking water helps. She has a sensation of foreign body in her throat and mucus built-up and coughing up lots of mucus. She gets darker yellow mucus. She has never seen Pulm. She gets itchy/swollen throat. She has nasal congestion and feels that her allergies have been acting up. She is on Zyrtec daily. She is not on nasal sprays. Tried before and they did not seem to help.     ED note from 01/11/2023 Mikaya Bunner is a 22 y.o. female with a past medical history of [place]in allergy for seasonal dysphagia, GERD who presents to the emergency department for evaluation of throat pain. Patient states she drank cold medicine and immediately after felt left-sided low chest pain that lasted a few seconds. Reports resolution of symptoms since. Believes she silent aspirated. Denies palpitations, coughing, current chest pain, shortness of breath, abdominal pain, back pain. The patient is stable and nontoxic with likely esophageal spasm or MSK pain. There is no clinical evidence of pneumonia, PTA, retropharyngeal abscess, tonsillitis. The patient is well-hydrated and in no acute distress. I do not believe serologic or imaging is warranted at this time. Patient will be advised to follow up with her laryngologist.  Precautions were provided to return to emergency department if any symptoms worsened or for any concerns. The patient will be instructed to schedule an appointment with a primary care physician for follow-up. The patient voiced understanding of this plan and was satisfied with the care provided.   ED note from 01/15/2023 Isha Seefeld is a 22 y.o. female history of GERD presenting with hemoptysis. Patient states that while eating dinner today, patient had episode where she is coughing with noted blood-tinged mucus 20s. Otherwise, asymptomatic. Patient without significant dyspnea or chest pain. Patient notes that she has had GERD for about 6 months that she is being treated for. Denies recent travel, fever. Patient states that she was alarmed by blood and wanted to be evaluated.  A/P Selicia Windom is a 22 y.o. female history of GERD presenting with hemoptysis. Patient is generally well-appearing with vital signs within normal limits. Chest x-ray obtained in triage without acute pathology based on independent radiology review. Well, presentation reassuring patient with small-volume hemoptysis. No other  significant symptoms suggest infection or trauma including esophageal tears. Suspect the patient's chronic cough in the setting of GERD, patient may have some irritation causing small-volume hemoptysis. Recommended conservative management and follow-up with her doctor for further management. Patient expressed understanding of and agreement to assessment and plan. Discharged with return precautions.   Initial Evaluation 01/02/23:  Reason for Consult: dysphagia, globus sensation, odynophagia and throat mucus   HPI: DONI BACHA is an 22 y.o. female with hx of  asthma and environmental/seasonal allergies, hx of GERD on PPI, here for evaluation of dysphagia, globus sensation, odynophagia and excessive mucus in her throat.  She had an episode of a pill stuck in her throat in January 2024, she then developed recurrent sx of throat discomfort and inability to swallow solid foods where food does not want to go down, and she has to spit it back up.  She has nasal congestion and lots of mucus in her throat. She feels that she cannot swallow meats, harder fruits, some crunchy foods. She spits it up. She coughs up yellow and white mucus frequently. She switched to soft foods 2/2 sx. She feels her sx worsen later in the day. She is on allergy shots x 1 mo, and on Zyrtec daily. She was previously on allergy shots, but stopped them (relocated for school). Feels it is helping. She has post-nasal drainage.  On Omeprazole 20 mg BID for heartburn, and since she started taking it, her throat sx are better. No hx of food allergies. No hx of GI evaluation/EGD in the past. No dysphonia. She reports feeling winded at times when she eats and food gets stuck and she has to cough it back up, no dyspnea.  At times, she feels like she swallowed glass when eating.  The patient was previously seen by Dr. Ernestene Kiel ENT, who documented 18 pound weight loss based on the patient history since symptom onset.  Esophagram was negative for  stricture or anatomic obstruction and only showed evidence of GERD.    Records Reviewed:  Seen in ED/UC for migraine 09/2022 PCP note 11/14/22 Hx of migraines, hx of atopic dermatitis, hx of borderline personality d/o   Office note by Dr Ernestene Kiel  HISTORY OF PRESENT ILLNESS: Rachelle Edwards is a 22 y.o. year old female who presents for complaint of sore throat and sensation of lump in her throat. Notably, she was seen by ENT, Dr. Lucita Lora in [place], on 06/21/2022 and underwent an in-office laryngoscopy, which was normal. She has a history of GERD and takes omeprazole.  The patient has been experiencing severe "throat congestion" for several months, characterized by expectoration of phlegm. She also reports a sensation akin to glass or a thick blockage in her throat. Initially, she managed her GERD symptoms through dietary modifications and initiation of omeprazole, which completely resolved her symptoms for a duration of one week. However, the omeprazole and dietary modifications have proven ineffective in managing her symptoms. Despite adhering to a regimen of omeprazole and dietary modifications, she continues to experience throat discomfort, particularly when swallowing. She reports no issues with liquids, but experiences difficulty swallowing food, particularly meat, and has to frequently chew foods like yogurt and apple sauce. She has lost approximately 18 pounds  since the onset of her symptoms. She denies any choking episodes. She denies any facial numbness. She denies any throat trauma or neck surgery. Her symptoms began in 05/2022 following an incorrect pill ingestion, which she believes may have resulted in a minor scratch in her throat. Her heartburn is infrequent, depending on her diet, and she has eliminated spicy or fried foods.  She drinks very little alcohol. She works at a [employer]. Moving back to [place] in 1 month.  No hx of neck surgery or neck trauma.    RECOMMENDATIONS:  Discussed that I do not appreciate any vocal fold immobility, vocal fold lesions, or vocal fold atrophy/presbylarynges. Symptoms are suggestive of irritation from possible acid reflux/LPR. I have recommended a trial of PPI (OTC or via PCP) as well as dietary and lifestyle modifications to reduce acid production. Discussed that LPR patients often do not have classic signs of GERD (heartburn), but instead may have hoarseness, throat clearing, dysphagia, increased phlegm, and globus sensation.  Due to dysphagia and weight loss I recommended referral to [facility] laryngologist Dr. Irene Pap and MBS/barium esophagram.    Past Medical History:  Diagnosis Date   Asthma    Asthma, mild persistent 02/04/2011   Atopic dermatitis 02/04/2011   Constipation    Cough    Rhinitis     Past Surgical History:  Procedure Laterality Date   FOOT SURGERY  01/18/2022    Family History  Problem Relation Age of Onset   Heart disease Father    Hyperlipidemia Father    Hypertension Father    Asthma Father    Depression Father    Heart disease Maternal Grandmother    Cancer Maternal Grandmother        breeast   Stroke Maternal Grandmother    Hypertension Maternal Grandmother    Depression Maternal Grandmother    Hearing loss Maternal Grandfather    Heart disease Paternal Grandmother    Hyperlipidemia Paternal Grandmother    Hypertension Paternal Grandmother    Heart disease Paternal Grandfather    Hyperlipidemia Paternal Grandfather    Hypertension Paternal Grandfather    Depression Mother    Asthma Maternal Aunt    Asthma Paternal Aunt    Alcohol abuse Neg Hx    Arthritis Neg Hx    Birth defects Neg Hx    COPD Neg Hx    Diabetes Neg Hx    Drug abuse Neg Hx    Early death Neg Hx    Kidney disease Neg Hx    Learning disabilities Neg Hx    Mental illness Neg Hx    Mental retardation Neg Hx    Miscarriages / Stillbirths Neg Hx    Vision loss Neg Hx    Varicose Veins Neg Hx      Social History:  reports that she has never smoked. She has never used smokeless tobacco. She reports that she does not drink alcohol and does not use drugs.  Allergies:  Allergies  Allergen Reactions   Mold Extract [Trichophyton Mentagrophyte]    Pollen Extract-Tree Extract     Medications: I have reviewed the patient's current medications.  The PMH, PSH, Medications, Allergies, and SH were reviewed and updated.  ROS: Constitutional: Negative for fever, weight loss and weight gain. Cardiovascular: Negative for chest pain and dyspnea on exertion. Respiratory: Is not experiencing shortness of breath at rest. Gastrointestinal: Negative for nausea and vomiting. Neurological: Negative for headaches. Psychiatric: The patient is not nervous/anxious  There were no vitals taken  for this visit.  PHYSICAL EXAM:  Exam: General: Well-developed, well-nourished Communication and Voice: Clear pitch and clarity Respiratory Respiratory effort: Equal inspiration and expiration without stridor Cardiovascular Peripheral Vascular: Warm extremities with equal color/perfusion Eyes: No nystagmus with equal extraocular motion bilaterally Neuro/Psych/Balance: Patient oriented to person, place, and time; Appropriate mood and affect; Gait is intact with no imbalance; Cranial nerves I-XII are intact Head and Face Inspection: Normocephalic and atraumatic without mass or lesion Palpation: Facial skeleton intact without bony stepoffs Salivary Glands: No mass or tenderness Facial Strength: Facial motility symmetric and full bilaterally ENT Pinna: External ear intact and fully developed External canal: Canal is patent with intact skin Tympanic Membrane: Clear and mobile External Nose: No scar or anatomic deformity Internal Nose: Septum is relatively straight on the left with caudal septal spur on the right. No polyp, or purulence. Mucosal edema and erythema present.  Bilateral inferior turbinate  hypertrophy.  Lips, Teeth, and gums: Mucosa and teeth intact and viable TMJ: No pain to palpation with full mobility Oral cavity/oropharynx: No erythema or exudate, no lesions present Nasopharynx: No mass or lesion with intact mucosa Hypopharynx: Intact mucosa without pooling of secretions Larynx Glottic: Full true vocal cord mobility without lesion or mass Supraglottic: Normal appearing epiglottis and AE folds Interarytenoid Space: Moderate edema and pachydermia (no improvement from last exam) Subglottic Space: Patent without lesion or edema Neck Neck and Trachea: Midline trachea without mass or lesion Thyroid: No mass or nodularity Lymphatics: No lymphadenopathy  Procedure: Preoperative diagnosis: Dysphagia odynophagia hemoptysis GERD LPR  Postoperative diagnosis:   Same  Procedure: Flexible fiberoptic laryngoscopy  Surgeon: Ashok Croon, MD  Anesthesia: Topical lidocaine and Afrin Complications: None Condition is stable throughout exam  Indications and consent:  The patient presents to the clinic with Indirect laryngoscopy view was incomplete. Thus it was recommended that they undergo a flexible fiberoptic laryngoscopy. All of the risks, benefits, and potential complications were reviewed with the patient preoperatively and verbal informed consent was obtained.  Procedure: The patient was seated upright in the clinic. Topical lidocaine and Afrin were applied to the nasal cavity. After adequate anesthesia had occurred, I then proceeded to pass the flexible telescope into the nasal cavity. The nasal cavity was patent without rhinorrhea or polyp. The nasopharynx was also patent without mass or lesion. The base of tongue was visualized and was normal. There were no signs of pooling of secretions in the piriform sinuses. The true vocal folds were mobile bilaterally. There were no signs of glottic or supraglottic mucosal lesion or mass. There was moderate interarytenoid pachydermia  and post cricoid edema. The telescope was then slowly withdrawn and the patient tolerated the procedure throughout.    PROCEDURE NOTE: nasal endoscopy  Preoperative diagnosis: chronic nasal congestion symptoms  Postoperative diagnosis: same  Procedure: Diagnostic nasal endoscopy (40981)  Surgeon: Ashok Croon, M.D.  Anesthesia: Topical lidocaine and Afrin  H&P REVIEW: The patient's history and physical were reviewed today prior to procedure. All medications were reviewed and updated as well. Complications: None Condition is stable throughout exam Indications and consent: The patient presents with symptoms of chronic sinusitis not responding to previous therapies. All the risks, benefits, and potential complications were reviewed with the patient preoperatively and informed consent was obtained. The time out was completed with confirmation of the correct procedure.   Procedure: The patient was seated upright in the clinic. Topical lidocaine and Afrin were applied to the nasal cavity. After adequate anesthesia had occurred, the rigid nasal endoscope was passed into  the nasal cavity. The nasal mucosa, turbinates, septum, and sinus drainage pathways were visualized bilaterally. This revealed no purulence or significant secretions that might be cultured. There were no polyps or sites of significant inflammation. The mucosa was intact and there was no crusting present. The scope was then slowly withdrawn and the patient tolerated the procedure well. There were no complications or blood loss.   Studies Reviewed: Esophagram 12/24/22 EXAM: ESOPHAGUS/BARIUM SWALLOW/TABLET STUDY   TECHNIQUE: Single contrast examination was performed using thin liquid barium. This exam was performed by Anders Grant NP and was supervised and interpreted by Dr. Sebastian Ache.   FLUOROSCOPY: Radiation Exposure Index (as provided by the fluoroscopic device): 7.3 mGy Kerma   COMPARISON:  None Available.    FINDINGS: This examination is limited as the patient was unable to tolerate the double contrast portion, and predominantly small boluses of swallowed barium result in suboptimal esophageal distension.   Swallowing: Appears normal. No vestibular penetration or aspiration seen.   Pharynx: Unremarkable.   Esophagus: No evidence of a significant stricture or mass within study limitations.   Esophageal motility: Grossly within normal limits.   Hiatal Hernia: None.   Gastroesophageal reflux: Small volume gastroesophageal reflux observed to the upper third of the esophagus during the water siphon maneuver.   Ingested 13 mm barium tablet: Declined by the patient.   IMPRESSION: Limited esophagram as described above. Small volume gastroesophageal reflux.   Assessment/Plan: No diagnosis found.  22 year old female with history of environmental allergies previously on allergy shots and more recently resumed allergy shots for persistent nasal congestion and postnasal drainage, who is here for evaluation of dysphagia to solids, globus sensation, sensation of excessive mucus in her throat and intermittent odynophagia.  Esophagram showed GERD but no stricture or any other abnormalities.  Never had modified barium swallow.  On daily Zyrtec and resumed allergy shots 1 month ago, and feels that it is helping with nasal congestion.  Recently started PPI, but reports no changes in her throat symptoms.   On my exam today including nasal endoscopy and flexible laryngoscopy there was evidence of nasal septal deviation inferior turban hypertrophy and mucosal edema with nonpurulent postnasal drainage and no polyps, she had no masses or lesions to suggest pathology in oropharynx or around the larynx hypopharynx.  There was evidence of moderate postcricoid edema and pachydermia consistent with GERD LPR.  There was no pooling of secretions in piriforms.   I discussed exam findings with the patient and  suggested modified barium swallow to complete evaluation of her dysphagia symptoms.  Some of her symptoms resemble symptoms of CP dysfunction in the setting of chronic GERD LPR, although sensation of swallowing glass is not typically present in patients with CP hypertrophy from chronic GERD.  Other possibilities include EoE, will consider in the future and will refer to GI for EGD and biopsy.  I instructed the patient to transition to Pepcid and slowly wean off omeprazole.  She will try diet and lifestyle changes to minimize reflux.  She was also instructed to try alginates.  She will continue daily Zyrtec and start daily Flonase.  She will return after testing.  Of note she is going to move to [place] for school in approximately 1 month, and will likely return after swallow study later this fall.   - slowly transition from Omeprazole to Pepcid (new reflux medication we prescribed) - try Reflux Gourmet for reflux (available on [employer]) - continue daily Zyrtec and start daily Flonase - schedule swallow study  -  we will see you back in 2 months or when you are back in [place] for results review and for repeat exam   Update 03/03/23: She continues to have sx of throat discomfort, globus sensation and trouble with swallowing. Had multiple visits to ED and PCP, and was referred to GI, having EGD done in the next few weeks. She has chronic cough that sometimes leads to hemoptysis (low-volume, blood tinged mucus), thinks it is related to forceful bouts of cough. She has sensation of mucus in her throat. Did not experience improvement from alginates, and not on Flonase. Repeat scope exam today with evidence of findings c/w GERD LPR. Repeat nasal endoscopy w/o signs of epistaxis and no purulence or polyps to suggest chronic sinusitis.   GERD LPR - proceed with EGD - continue Pepcid 20 mg daily (ok to increase to BID as needed) and alginates at night   2. Chronic cough - suspect related to GERD/LPR if  continues will get chest imaging and send to Pulm particularly 2/2 report of mucus sensation/chronic cough and episodes of hemoptysis  3. Odynophagia/sore throat/trouble with swallowing and pain in epigastric region - suspect esophageal spasm  - management of GERD LPR above  - proceed with EGD  - gargle with salt water  - management of allergies and PND  4. Environmental allergies, nasal congestion  - continue Zyrtec 10 mg daily and Flonase 2 puffs b/l nares BID - will consider allergy testing in the future   5. Dysphagia  - normal esophagram with GERD findings but no anatomic obstruction  - did not schedule MBS - will complete if sx persist and EGD is unremarkable   We discussed that her sx could be related to eosinophilic eosinophilic esophagitis which can be ruled out during EGD with biopsies of esophageal lining - she will discuss with her GI provider  RTC 2 mo   Ashok Croon, MD [facility]ol[facility]ology [facility] Phone: [phone] Fax: [phone]    03/03/2023, 8:29 AM

## 2023-03-03 NOTE — Patient Instructions (Addendum)
-   eosinophilic esophagitis - please research the condition and proceed with upper endoscopy/EGD with GI - we will consider Pulmonary consult if you continue to cough up blood - continue Zyrtec and start Flonase - gargle with salt water - hydrate and drink water  - try Reflux Gourmet at night and continue reflux medication (Omeprazole) - return after EGD in 2 months

## 2023-05-05 ENCOUNTER — Encounter (INDEPENDENT_AMBULATORY_CARE_PROVIDER_SITE_OTHER): Payer: Self-pay | Admitting: Otolaryngology

## 2023-05-05 ENCOUNTER — Ambulatory Visit (INDEPENDENT_AMBULATORY_CARE_PROVIDER_SITE_OTHER): Payer: BC Managed Care – PPO | Admitting: Otolaryngology

## 2023-05-05 VITALS — BP 102/69 | HR 72

## 2023-05-05 DIAGNOSIS — R0981 Nasal congestion: Secondary | ICD-10-CM

## 2023-05-05 DIAGNOSIS — R0982 Postnasal drip: Secondary | ICD-10-CM

## 2023-05-05 DIAGNOSIS — J342 Deviated nasal septum: Secondary | ICD-10-CM

## 2023-05-05 DIAGNOSIS — R1310 Dysphagia, unspecified: Secondary | ICD-10-CM | POA: Diagnosis not present

## 2023-05-05 DIAGNOSIS — K219 Gastro-esophageal reflux disease without esophagitis: Secondary | ICD-10-CM

## 2023-05-05 DIAGNOSIS — R09A2 Foreign body sensation, throat: Secondary | ICD-10-CM

## 2023-05-05 DIAGNOSIS — J312 Chronic pharyngitis: Secondary | ICD-10-CM

## 2023-05-05 DIAGNOSIS — J3089 Other allergic rhinitis: Secondary | ICD-10-CM

## 2023-05-05 MED ORDER — AZELASTINE HCL 0.1 % NA SOLN: 2.0000 | Freq: Two times a day (BID) | NASAL | 12 refills | Status: AC

## 2023-05-05 MED ORDER — SALINE SPRAY 0.65 % NA SOLN: 1.0000 | NASAL | 5 refills | Status: AC | PRN

## 2023-05-05 NOTE — Patient Instructions (Signed)
[email or web address] - check out this website to learn more about reflux   -Avoid lying down for at least two hours after a meal or after drinking acidic beverages, like soda, or other caffeinated beverages. This can help to prevent stomach contents from flowing back into the esophagus. -Keep your head elevated while you sleep. Using an extra pillow or two can also help to prevent reflux. -Eat smaller and more frequent meals each day instead of a few large meals. This promotes digestion and can aid in preventing heartburn. -Wear loose-fitting clothes to ease pressure on the stomach, which can worsen heartburn and reflux. -Reduce excess weight around the midsection. This can ease pressure on the stomach. Such pressure can force some stomach contents back up the esophagus

## 2023-05-05 NOTE — Progress Notes (Signed)
ENT Progress Note:  Update 05/05/23  Discussed the use of AI scribe software for clinical note transcription with the patient, who gave verbal consent to proceed.  History of Present Illness   The patient is here for f/u after GI and EGD. She reports persistent symptoms despite dietary modifications and medications for GERD. They report an improvement in swallowing but continue to experience anxiety related to swallowing. They also describe recurrent chest pain, initially localized to the lower chest/epigastric area, but now spreading across the chest and radiating to the back and armpits. These pains are sharp, lasting for about ten minutes before fading. The patient has sought medical attention for these symptoms in the past, with an EKG performed to rule out cardiac causes, and it was negative per report.  The patient has also been experiencing throat pain, varying in intensity from day to day. They describe it as a sore throat, often worse at the end of the day, which they attribute to GERD. They have been managing this with increased water intake.  In addition to these symptoms, the patient reports increased postnasal drainage, which they have been managing with Zyrtec. They have tried Flonase in the past but found it ineffective.   The patient has been taking Nexium for GERD, which they believe has provided some relief. They have also been advised to try a chewable antacid similar to [facility] before bed to help manage nighttime symptoms. They have tried Reflux Gourmet in the past but were unsure of its effectiveness.  The patient works partially outdoors, which involves transitioning between hot and cold environments, potentially exacerbating their post-nasal drainage symptoms. They have been attempting to manage their diet to reduce reflux triggers but find this challenging due to the prevalence of acidic foods and the blandness of the alternatives.     Records Reviewed:  GI EGD report  03/12/23  GI office visit  Imaging: The following results were personally reviewed: Esophagram 12/2022: Limited esophagram as described above. Small volume gastroesophageal  reflux.   CT soft tissue neck 06/15/22: No acute process in the neck.    Assessment/Plan   Nicole Snyder is a 21yo female with PMH asthma and seasonal allergies who presents with complaints of heartburn, dysphagia, and odynophagia.  1. Dysphagia, unspecified type  2. Gastroesophageal reflux disease, unspecified whether esophagitis present  3. Constipation, unspecified constipation type   # Dysphagia # GERD I have recommended upper endoscopy with esophageal biopsies and potential dilation for further evaluation of her symptoms. Question gastroesophageal reflux disease +/- peptic stricture, eosinophilic esophagitis. She will continue omeprazole for now. She will follow-up with ENT as scheduled next week.  # Constipation - Trial of fiber: Daily psyllium (Metamucil), methylcellulose (Citrucel), or benefiber for improved stool consistency to supplement high fiber diet with goal at least 25 gm fiber daily - Start Miralax 1 capful (17 gm) daily and titrate up or down every 3-4 days to achieve goal of having 1 soft, formed bowel movement daily - Drink plenty of water (6-8 glasses per day)   Orders Placed This Encounter    Update 03/03/23: She returns for 22-month follow-up.  Records review indicate that she was seen in the ER twice since last office visit 2 months ago.  She was seen 01/11/2023 for persistent sore throat and left-sided chest pain after taking cold medication, she was worried that she aspirated something.  Based on ED evaluation at the time she was thought to have esophageal spasm and was instructed to see her primary care  physician.  She then returned to the ED 4 days later with a report of coughing up mucus with bright red blood in the mucus, and after unremarkable evaluation in the ED she again was  instructed to see her family practitioner.  Since the last ED visit on 01/07/2023 she saw family medicine twice on 01/23/2023 and 01/26/2023, and was sent to see GI.  Unfortunately family medicine notes are not available for review, but based on GI encounter few days ago she was advised a trial of fiber Miralax and hydration, and will be scheduled for upper endoscopy in the near future.  She was also instructed to continue omeprazole for known history of GERD LPR.  Our plan last time included scheduling modified barium swallow, due to reported symptoms of dysphagia, but unfortunately records indicate that she did not complete modified barium swallow at this time.  She tried Reflux Gourmet, and not sure if it helped. She is on PPI still. She gets sharp stabbing pains around her epigastric area and around her flanks. She has chronic cough and coughs up blood tinged mucus from time to time. Drinking water helps. She has a sensation of foreign body in her throat and mucus built-up and coughing up lots of mucus. She gets darker yellow mucus. She has never seen Pulm. She gets itchy/swollen throat. She has nasal congestion and feels that her allergies have been acting up. She is on Zyrtec daily. She is not on nasal sprays. Tried before and they did not seem to help.     ED note from 01/11/2023 Nicole Snyder is a 22 y.o. female with a past medical history of F[place]in allergy for seasonal dysphagia, GERD who presents to the emergency department for evaluation of throat pain. Patient states she drank cold medicine and immediately after felt left-sided low chest pain that lasted a few seconds. Reports resolution of symptoms since. Believes she silent aspirated. Denies palpitations, coughing, current chest pain, shortness of breath, abdominal pain, back pain. The patient is stable and nontoxic with likely esophageal spasm or MSK pain. There is no clinical evidence of pneumonia, PTA, retropharyngeal abscess, tonsillitis. The  patient is well-hydrated and in no acute distress. I do not believe serologic or imaging is warranted at this time. Patient will be advised to follow up with her laryngologist.  Precautions were provided to return to emergency department if any symptoms worsened or for any concerns. The patient will be instructed to schedule an appointment with a primary care physician for follow-up. The patient voiced understanding of this plan and was satisfied with the care provided.   ED note from 01/15/2023 Saul Conrado is a 22 y.o. female history of GERD presenting with hemoptysis. Patient states that while eating dinner today, patient had episode where she is coughing with noted blood-tinged mucus 20s. Otherwise, asymptomatic. Patient without significant dyspnea or chest pain. Patient notes that she has had GERD for about 6 months that she is being treated for. Denies recent travel, fever. Patient states that she was alarmed by blood and wanted to be evaluated.  A/P Tommi Gilroy is a 22 y.o. female history of GERD presenting with hemoptysis. Patient is generally well-appearing with vital signs within normal limits. Chest x-ray obtained in triage without acute pathology based on independent radiology review. Well, presentation reassuring patient with small-volume hemoptysis. No other significant symptoms suggest infection or trauma including esophageal tears. Suspect the patient's chronic cough in the setting of GERD, patient may have some irritation causing small-volume hemoptysis. Recommended conservative  management and follow-up with her doctor for further management. Patient expressed understanding of and agreement to assessment and plan. Discharged with return precautions.   Initial Evaluation 01/02/23:  Reason for Consult: dysphagia, globus sensation, odynophagia and throat mucus   HPI: Nicole Snyder is an 22 y.o. female with hx of  asthma and environmental/seasonal allergies, hx of GERD on PPI,  here for evaluation of dysphagia, globus sensation, odynophagia and excessive mucus in her throat.  She had an episode of a pill stuck in her throat in January 2024, she then developed recurrent sx of throat discomfort and inability to swallow solid foods where food does not want to go down, and she has to spit it back up.  She has nasal congestion and lots of mucus in her throat. She feels that she cannot swallow meats, harder fruits, some crunchy foods. She spits it up. She coughs up yellow and white mucus frequently. She switched to soft foods 2/2 sx. She feels her sx worsen later in the day. She is on allergy shots x 1 mo, and on Zyrtec daily. She was previously on allergy shots, but stopped them (relocated for school). Feels it is helping. She has post-nasal drainage.  On Omeprazole 20 mg BID for heartburn, and since she started taking it, her throat sx are better. No hx of food allergies. No hx of GI evaluation/EGD in the past. No dysphonia. She reports feeling winded at times when she eats and food gets stuck and she has to cough it back up, no dyspnea.  At times, she feels like she swallowed glass when eating.  The patient was previously seen by Dr. Ernestene Kiel ENT, who documented 18 pound weight loss based on the patient history since symptom onset.  Esophagram was negative for stricture or anatomic obstruction and only showed evidence of GERD.    Records Reviewed:  Seen in ED/UC for migraine 09/2022 PCP note 11/14/22 Hx of migraines, hx of atopic dermatitis, hx of borderline personality d/o   Office note by Dr Ernestene Kiel  HISTORY OF PRESENT ILLNESS: Zakkiyya Reaves is a 22 y.o. year old female who presents for complaint of sore throat and sensation of lump in her throat. Notably, she was seen by ENT, Dr. Lucita Lora in [place], on 06/21/2022 and underwent an in-office laryngoscopy, which was normal. She has a history of GERD and takes omeprazole.  The patient has been experiencing severe "throat  congestion" for several months, characterized by expectoration of phlegm. She also reports a sensation akin to glass or a thick blockage in her throat. Initially, she managed her GERD symptoms through dietary modifications and initiation of omeprazole, which completely resolved her symptoms for a duration of one week. However, the omeprazole and dietary modifications have proven ineffective in managing her symptoms. Despite adhering to a regimen of omeprazole and dietary modifications, she continues to experience throat discomfort, particularly when swallowing. She reports no issues with liquids, but experiences difficulty swallowing food, particularly meat, and has to frequently chew foods like yogurt and apple sauce. She has lost approximately 18 pounds since the onset of her symptoms. She denies any choking episodes. She denies any facial numbness. She denies any throat trauma or neck surgery. Her symptoms began in 05/2022 following an incorrect pill ingestion, which she believes may have resulted in a minor scratch in her throat. Her heartburn is infrequent, depending on her diet, and she has eliminated spicy or fried foods.  She drinks very little alcohol. She works at a [employer]  fac[employer]. Moving back to [place] in 1 month.  No hx of neck surgery or neck trauma.   RECOMMENDATIONS:  Discussed that I do not appreciate any vocal fold immobility, vocal fold lesions, or vocal fold atrophy/presbylarynges. Symptoms are suggestive of irritation from possible acid reflux/LPR. I have recommended a trial of PPI (OTC or via PCP) as well as dietary and lifestyle modifications to reduce acid production. Discussed that LPR patients often do not have classic signs of GERD (heartburn), but instead may have hoarseness, throat clearing, dysphagia, increased phlegm, and globus sensation.  Due to dysphagia and weight loss I recommended referral to [facility] laryngologist Dr. Irene Pap and MBS/barium esophagram.     Past Medical History:  Diagnosis Date   Asthma    Asthma, mild persistent 02/04/2011   Atopic dermatitis 02/04/2011   Constipation    Cough    Rhinitis     Past Surgical History:  Procedure Laterality Date   FOOT SURGERY  01/18/2022    Family History  Problem Relation Age of Onset   Heart disease Father    Hyperlipidemia Father    Hypertension Father    Asthma Father    Depression Father    Heart disease Maternal Grandmother    Cancer Maternal Grandmother        breeast   Stroke Maternal Grandmother    Hypertension Maternal Grandmother    Depression Maternal Grandmother    Hearing loss Maternal Grandfather    Heart disease Paternal Grandmother    Hyperlipidemia Paternal Grandmother    Hypertension Paternal Grandmother    Heart disease Paternal Grandfather    Hyperlipidemia Paternal Grandfather    Hypertension Paternal Grandfather    Depression Mother    Asthma Maternal Aunt    Asthma Paternal Aunt    Alcohol abuse Neg Hx    Arthritis Neg Hx    Birth defects Neg Hx    COPD Neg Hx    Diabetes Neg Hx    Drug abuse Neg Hx    Early death Neg Hx    Kidney disease Neg Hx    Learning disabilities Neg Hx    Mental illness Neg Hx    Mental retardation Neg Hx    Miscarriages / Stillbirths Neg Hx    Vision loss Neg Hx    Varicose Veins Neg Hx     Social History:  reports that she has never smoked. She has never used smokeless tobacco. She reports that she does not drink alcohol and does not use drugs.  Allergies:  Allergies  Allergen Reactions   Bee Pollen Other (See Comments)    Sneezing   Mold Extract [Trichophyton Mentagrophyte]    Pollen Extract-Tree Extract     Medications: I have reviewed the patient's current medications.  The PMH, PSH, Medications, Allergies, and SH were reviewed and updated.  ROS: Constitutional: Negative for fever, weight loss and weight gain. Cardiovascular: Negative for chest pain and dyspnea on exertion. Respiratory: Is not  experiencing shortness of breath at rest. Gastrointestinal: Negative for nausea and vomiting. Neurological: Negative for headaches. Psychiatric: The patient is not nervous/anxious  Blood pressure 102/69, pulse 72, SpO2 99%.  PHYSICAL EXAM:  Exam: General: Well-developed, well-nourished Communication and Voice: Clear pitch and clarity Respiratory Respiratory effort: Equal inspiration and expiration without stridor Cardiovascular Peripheral Vascular: Warm extremities with equal color/perfusion Eyes: No nystagmus with equal extraocular motion bilaterally Neuro/Psych/Balance: Patient oriented to person, place, and time; Appropriate mood and affect; Gait is intact with no imbalance; Cranial nerves I-XII are  intact Head and Face Inspection: Normocephalic and atraumatic without mass or lesion Palpation: Facial skeleton intact without bony stepoffs Salivary Glands: No mass or tenderness Facial Strength: Facial motility symmetric and full bilaterally ENT Pinna: External ear intact and fully developed External canal: Canal is patent with intact skin Tympanic Membrane: Clear and mobile External Nose: No scar or anatomic deformity Internal Nose: Septum is relatively straight on the left with caudal septal spur on the right. No polyp, or purulence. Mucosal edema and erythema present.  Bilateral inferior turbinate hypertrophy.  Lips, Teeth, and gums: Mucosa and teeth intact and viable TMJ: No pain to palpation with full mobility Oral cavity/oropharynx: No erythema or exudate, no lesions present 1+ tonsils  Nasopharynx: No mass or lesion with intact mucosa Neck Neck and Trachea: Midline trachea without mass or lesion Thyroid: No mass or nodularity Lymphatics: No lymphadenopathy  Procedure: none  Studies Reviewed: Esophagram 12/24/22 EXAM: ESOPHAGUS/BARIUM SWALLOW/TABLET STUDY   TECHNIQUE: Single contrast examination was performed using thin liquid barium. This exam was performed by  Anders Grant NP and was supervised and interpreted by Dr. Sebastian Ache.   FLUOROSCOPY: Radiation Exposure Index (as provided by the fluoroscopic device): 7.3 mGy Kerma   COMPARISON:  None Available.   FINDINGS: This examination is limited as the patient was unable to tolerate the double contrast portion, and predominantly small boluses of swallowed barium result in suboptimal esophageal distension.   Swallowing: Appears normal. No vestibular penetration or aspiration seen.   Pharynx: Unremarkable.   Esophagus: No evidence of a significant stricture or mass within study limitations.   Esophageal motility: Grossly within normal limits.   Hiatal Hernia: None.   Gastroesophageal reflux: Small volume gastroesophageal reflux observed to the upper third of the esophagus during the water siphon maneuver.   Ingested 13 mm barium tablet: Declined by the patient.   IMPRESSION: Limited esophagram as described above. Small volume gastroesophageal reflux.   Assessment/Plan: Encounter Diagnoses  Name Primary?   Deviated nasal septum Yes   Globus sensation    Post-nasal drip    Nasal congestion    Chronic GERD    Environmental and seasonal allergies    Chronic sore throat     22 year old female with history of environmental allergies previously on allergy shots and more recently resumed allergy shots for persistent nasal congestion and postnasal drainage, who is here for evaluation of dysphagia to solids, globus sensation, sensation of excessive mucus in her throat and intermittent odynophagia.  Esophagram showed GERD but no stricture or any other abnormalities.  Never had modified barium swallow.  On daily Zyrtec and resumed allergy shots 1 month ago, and feels that it is helping with nasal congestion.  Recently started PPI, but reports no changes in her throat symptoms.   On my exam today including nasal endoscopy and flexible laryngoscopy there was evidence of nasal septal  deviation inferior turban hypertrophy and mucosal edema with nonpurulent postnasal drainage and no polyps, she had no masses or lesions to suggest pathology in oropharynx or around the larynx hypopharynx.  There was evidence of moderate postcricoid edema and pachydermia consistent with GERD LPR.  There was no pooling of secretions in piriforms.   I discussed exam findings with the patient and suggested modified barium swallow to complete evaluation of her dysphagia symptoms.  Some of her symptoms resemble symptoms of CP dysfunction in the setting of chronic GERD LPR, although sensation of swallowing glass is not typically present in patients with CP hypertrophy from chronic GERD.  Other  possibilities include EoE, will consider in the future and will refer to GI for EGD and biopsy.  I instructed the patient to transition to Pepcid and slowly wean off omeprazole.  She will try diet and lifestyle changes to minimize reflux.  She was also instructed to try alginates.  She will continue daily Zyrtec and start daily Flonase.  She will return after testing.  Of note she is going to move to [place] for school in approximately 1 month, and will likely return after swallow study later this fall.   - slowly transition from Omeprazole to Pepcid (new reflux medication we prescribed) - try Reflux Gourmet for reflux (available on Amazon) - continue daily Zyrtec and start daily Flonase - schedule swallow study  - we will see you back in 2 months or when you are back in [place] for results review and for repeat exam   Update 03/03/23: She continues to have sx of throat discomfort, globus sensation and trouble with swallowing. Had multiple visits to ED and PCP, and was referred to GI, having EGD done in the next few weeks. She has chronic cough that sometimes leads to hemoptysis (low-volume, blood tinged mucus), thinks it is related to forceful bouts of cough. She has sensation of mucus in her throat. Did not  experience improvement from alginates, and not on Flonase. Repeat scope exam today with evidence of findings c/w GERD LPR. Repeat nasal endoscopy w/o signs of epistaxis and no purulence or polyps to suggest chronic sinusitis.   GERD LPR - proceed with EGD - continue Pepcid 20 mg daily (ok to increase to BID as needed) and alginates at night   2. Chronic cough - suspect related to GERD/LPR if continues will get chest imaging and send to Pulm particularly 2/2 report of mucus sensation/chronic cough and episodes of hemoptysis  3. Odynophagia/sore throat/trouble with swallowing and pain in epigastric region - suspect esophageal spasm  - management of GERD LPR above  - proceed with EGD  - gargle with salt water  - management of allergies and PND  4. Environmental allergies, nasal congestion  - continue Zyrtec 10 mg daily and Flonase 2 puffs b/l nares BID - will consider allergy testing in the future   5. Dysphagia  - normal esophagram with GERD findings but no anatomic obstruction  - did not schedule MBS - will complete if sx persist and EGD is unremarkable   We discussed that her sx could be related to eosinophilic eosinophilic esophagitis which can be ruled out during EGD with biopsies of esophageal lining - she will discuss with her GI provider  RTC 2 mo  Update 05/05/23 EGD was unremarkable with bx negative for EoE She reports improvement in her swallowing overall.   Assessment and Plan    Chronic Gastroesophageal Reflux Disease (GERD) and some dysphagia, currently reports improved swallowing  Chronic GERD with intermittent sharp chest pain radiating to the back and armpits, sore throat, and chest pain exacerbated by diet. EGD showed mild gastritis no evidence of EoE. Current management includes Nexium and dietary modifications with some improvement. Differential diagnosis includes other causes of chest pain, but previous EKG ruled out cardiac issues. Discussed consistent use of  reflux management strategies, potential benefits of alginate therapy, Reflux Gourmet, and dietary recommendations from Concepcion Elk, MD. Emphasized the need for a second opinion to rule out other causes of chest pain. - Continue Nexium 20 mg BID - Start Reflux Gourmet after meals - Implement dietary modifications as per Albertina Parr  recommendations, detailed handout given about GERD management  - Consult primary care provider for a second opinion on causes of chest pain radiating to the back   Chronic nasal congestion and Postnasal Drip/Environmental allergies  Chronic postnasal drip with associated throat irritation and occasional epistaxis. Current management includes Zyrtec; Flonase was ineffective. Symptoms exacerbated by seasonal allergies and environmental factors. Discussed azelastine nasal spray as an alternative to Flonase. Recommended ocean spray and Vaseline for nasal dryness and to prevent epistaxis. - Start azelastine nasal spray 2 puffs b/l nares BID - Continue Zyrtec 10 mg daily  - Use ocean spray and Vaseline for nasal dryness  Follow-up - Schedule follow-up appointment in a few months.      Ashok Croon, MD Otolaryngology [facility] Phone: [phone] Fax: [phone]    05/05/2023, 2:19 PM

## 2023-07-11 ENCOUNTER — Ambulatory Visit (INDEPENDENT_AMBULATORY_CARE_PROVIDER_SITE_OTHER): Payer: BC Managed Care – PPO | Admitting: Otolaryngology
